# Patient Record
Sex: Female | Born: 1980
Health system: Southern US, Community
[De-identification: ages and names within clinical notes are randomized; demographics above are authoritative.]

## PROBLEM LIST (undated history)

## (undated) DIAGNOSIS — F419 Anxiety disorder, unspecified: Secondary | ICD-10-CM

## (undated) DIAGNOSIS — T7840XA Allergy, unspecified, initial encounter: Secondary | ICD-10-CM

## (undated) DIAGNOSIS — R519 Headache, unspecified: Secondary | ICD-10-CM

## (undated) DIAGNOSIS — G935 Compression of brain: Secondary | ICD-10-CM

## (undated) DIAGNOSIS — D649 Anemia, unspecified: Secondary | ICD-10-CM

## (undated) DIAGNOSIS — J189 Pneumonia, unspecified organism: Secondary | ICD-10-CM

## (undated) DIAGNOSIS — F329 Major depressive disorder, single episode, unspecified: Secondary | ICD-10-CM

## (undated) DIAGNOSIS — F32A Depression, unspecified: Secondary | ICD-10-CM

## (undated) DIAGNOSIS — Z87442 Personal history of urinary calculi: Secondary | ICD-10-CM

## (undated) DIAGNOSIS — E559 Vitamin D deficiency, unspecified: Secondary | ICD-10-CM

## (undated) DIAGNOSIS — K219 Gastro-esophageal reflux disease without esophagitis: Secondary | ICD-10-CM

## (undated) HISTORY — PX: ANKLE SURGERY: SHX546

## (undated) HISTORY — DX: Vitamin D deficiency, unspecified: E55.9

## (undated) HISTORY — DX: Allergy, unspecified, initial encounter: T78.40XA

## (undated) HISTORY — PX: FRACTURE SURGERY: SHX138

## (undated) HISTORY — PX: ABDOMINAL HYSTERECTOMY: SHX81

## (undated) HISTORY — DX: Anemia, unspecified: D64.9

---

## 1999-11-09 HISTORY — PX: TUBAL LIGATION: SHX77

## 2002-03-13 ENCOUNTER — Other Ambulatory Visit: Admission: RE | Admit: 2002-03-13 | Discharge: 2002-03-13 | Payer: Self-pay | Admitting: Unknown Physician Specialty

## 2002-03-15 ENCOUNTER — Ambulatory Visit (HOSPITAL_COMMUNITY): Admission: RE | Admit: 2002-03-15 | Discharge: 2002-03-15 | Payer: Self-pay | Admitting: Unknown Physician Specialty

## 2002-03-15 ENCOUNTER — Encounter: Payer: Self-pay | Admitting: Unknown Physician Specialty

## 2003-07-04 ENCOUNTER — Emergency Department (HOSPITAL_COMMUNITY): Admission: EM | Admit: 2003-07-04 | Discharge: 2003-07-04 | Payer: Self-pay | Admitting: Emergency Medicine

## 2003-07-04 ENCOUNTER — Encounter: Payer: Self-pay | Admitting: Emergency Medicine

## 2003-11-07 ENCOUNTER — Emergency Department (HOSPITAL_COMMUNITY): Admission: EM | Admit: 2003-11-07 | Discharge: 2003-11-07 | Payer: Self-pay | Admitting: Emergency Medicine

## 2007-11-10 ENCOUNTER — Emergency Department (HOSPITAL_COMMUNITY): Admission: EM | Admit: 2007-11-10 | Discharge: 2007-11-10 | Payer: Self-pay | Admitting: Emergency Medicine

## 2007-12-06 ENCOUNTER — Ambulatory Visit: Payer: Self-pay | Admitting: Internal Medicine

## 2007-12-07 DIAGNOSIS — K7689 Other specified diseases of liver: Secondary | ICD-10-CM | POA: Insufficient documentation

## 2007-12-07 DIAGNOSIS — E669 Obesity, unspecified: Secondary | ICD-10-CM | POA: Insufficient documentation

## 2007-12-07 DIAGNOSIS — K589 Irritable bowel syndrome without diarrhea: Secondary | ICD-10-CM | POA: Insufficient documentation

## 2007-12-07 DIAGNOSIS — M19079 Primary osteoarthritis, unspecified ankle and foot: Secondary | ICD-10-CM | POA: Insufficient documentation

## 2007-12-07 DIAGNOSIS — Z87442 Personal history of urinary calculi: Secondary | ICD-10-CM | POA: Insufficient documentation

## 2007-12-07 DIAGNOSIS — K760 Fatty (change of) liver, not elsewhere classified: Secondary | ICD-10-CM | POA: Insufficient documentation

## 2008-05-02 ENCOUNTER — Encounter: Admission: RE | Admit: 2008-05-02 | Discharge: 2008-06-11 | Payer: Self-pay | Admitting: Orthopedic Surgery

## 2011-03-26 NOTE — Assessment & Plan Note (Signed)
[facility]                         GASTROENTEROLOGY OFFICE NOTE   Caroline Hopkins                      MRN:          [id number]  DATE:12/06/2007                            DOB:          September 30, 1981    REFERRING PHYSICIAN:  Helene Kelp, PA-C   REASON FOR CONSULTATION:  Abdominal pain.   ASSESSMENT:  A 30 year old white woman who has had a 75-month history of  right upper quadrant pain radiating across her upper abdomen to the left  upper quadrant associated with severe cramping.  Lately, she has been  having diarrhea.  She has been investigated with blood work, stool  studies, and an ultrasound at the [facility] emergency department  recently.  She has fatty liver and possible hepatomegaly.  She has been  losing some weight.  She is under significant stress with a separation  in her marriage.   Based on all the available data and her exam today, I think she probably  has irritable bowel syndrome and a functional abdominal/gastrointestinal  disturbance.   PLAN:  1. She will send me the lab work from [employer], where      she has been worked up.  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  2. IBS handout is given.  3. Will be a period of observation.  4. Treat with Librax 1 q.6h as needed.  She can take before meals and      at bedtime as well.  5. Promethazine for nausea.  There has been some occasional vomiting      as well.  She has used some Reglan, but that may make diarrhea      worse.  6. Note, she assures me that all her lab work and stool studies have      been unremarkable, including clostridium difficile and cultures.      We will confirm this.  7. Should this not work and she continue to have problems, she is to      call back for another appointment.   HISTORY:  A 30 year old white woman with problems above.  She does have  heartburn for the last 5 years, but this is well-controlled on Prevacid.  The  symptoms she has now, as described above, have been episodic.  They  are not every day.  She clearly has been under a lot of stress with the  separation in her marriage.  See my medical history form for further  details.  Note, she has tried Reglan with some relief of nausea.  She  has used some hydrocodone 5/500 with relief of pain as well.   PAST MEDICAL HISTORY:  1. Osteoarthritis post traumatic after ankle and foot fracture 10      years ago.  2. Obesity.  3. Nephrolithiasis.  4. Allergies.  5. Prior tubal ligation.   MEDICATIONS:  1. Prevacid 30 mg daily.  2. Reglan 5 mg p.r.n.  3. Hydrocodone 5/500 one every 6 to 8 hours p.r.n.   DRUG ALLERGIES:  ROCEPHIN and LATEX.   FAMILY HISTORY/SOCIAL HISTORY/REVIEW OF SYSTEMS:  See my medical history  form for full details.  She smokes and is counseled to quit.  There is  no alcohol or drug use.  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  She is married, 2 sons, and going through the separation as  noted.  Review of systems, see my medical history form for full details  of that.  Ongoing fatigue as well.   PHYSICAL:  Height 5 feet 5 inches, weight 268 pounds, blood pressure  116/68, pulse 76.  This is a very obese young white woman in no acute distress.  The eyes are anicteric.  ENT normal mouth and posterior pharynx.  NECK:  Supple.  No thyromegaly or mass.  CHEST:  Clear.  HEART:  S1, S2.  No murmurs, rubs, or gallops.  ABDOMEN:  Obese.  It is slightly tender to diffuse light touch.  There  is no organomegaly or mass.  Bowel sounds present.  LOWER EXTREMITIES:  Free of edema.  SKIN:  Warm and dry.  No acute rash.  NEURO/PSYCH:  She is alert and oriented x3.  Nonfocal exam.  LYMPHATICS:  No neck or supraclavicular palpated.   Lab data from [facility] emergency department visit is reviewed, as well  as the note.  She was afebrile.  Vital signs normal.  Ultrasound of the  abdomen showed fatty liver, borderline to  mild splenomegaly as well as  borderline to mild hepatomegaly.  Her lipase was normal.  Her LFTs were  normal.  Her I-STAT showed normal electrolytes and hemoglobin.  Her  creatinine was normal as well.  Urinalysis was negative.   I appreciate the opportunity to care for this patient.     Iva Boop, MD,FACG  Electronically Signed    CEG/MedQ  DD: 12/07/2007  DT: 12/07/2007  Job #: [id number]   cc:   Helene Kelp, PA-C

## 2011-07-29 LAB — LIPASE, BLOOD: Lipase: 43

## 2011-07-29 LAB — I-STAT 8, (EC8 V) (CONVERTED LAB)
BUN: 8
Bicarbonate: 25.1 — ABNORMAL HIGH
Chloride: 110
Glucose, Bld: 101 — ABNORMAL HIGH
HCT: 43
Hemoglobin: 14.6
Operator id: 270651
Potassium: 3.7
Sodium: 144
TCO2: 26
pCO2, Ven: 43.6 — ABNORMAL LOW
pH, Ven: 7.368 — ABNORMAL HIGH

## 2011-07-29 LAB — URINALYSIS, ROUTINE W REFLEX MICROSCOPIC
Bilirubin Urine: NEGATIVE
Glucose, UA: NEGATIVE
Hgb urine dipstick: NEGATIVE
Ketones, ur: NEGATIVE
Nitrite: NEGATIVE
Protein, ur: NEGATIVE
Specific Gravity, Urine: 1.014
Urobilinogen, UA: 0.2
pH: 6

## 2011-07-29 LAB — HEPATIC FUNCTION PANEL
ALT: 23
AST: 18
Albumin: 3.8
Alkaline Phosphatase: 59
Bilirubin, Direct: 0.1
Indirect Bilirubin: 0.2 — ABNORMAL LOW
Total Bilirubin: 0.3
Total Protein: 7.6

## 2011-07-29 LAB — POCT PREGNANCY, URINE
Operator id: 27065
Preg Test, Ur: NEGATIVE

## 2011-07-29 LAB — POCT I-STAT CREATININE
Creatinine, Ser: 0.7
Operator id: 270651

## 2012-08-04 ENCOUNTER — Other Ambulatory Visit: Payer: Self-pay | Admitting: Neurosurgery

## 2012-08-04 DIAGNOSIS — R51 Headache: Secondary | ICD-10-CM

## 2012-08-04 DIAGNOSIS — G935 Compression of brain: Secondary | ICD-10-CM

## 2012-08-10 ENCOUNTER — Ambulatory Visit
Admission: RE | Admit: 2012-08-10 | Discharge: 2012-08-10 | Disposition: A | Discharge status: Home / Self Care | Payer: BC Managed Care – PPO | Source: Ambulatory Visit | Attending: Neurosurgery | Admitting: Neurosurgery

## 2012-08-10 DIAGNOSIS — R51 Headache: Secondary | ICD-10-CM

## 2012-08-10 DIAGNOSIS — G935 Compression of brain: Secondary | ICD-10-CM

## 2012-08-21 ENCOUNTER — Other Ambulatory Visit: Payer: Self-pay | Admitting: Neurosurgery

## 2012-08-28 ENCOUNTER — Encounter (HOSPITAL_COMMUNITY): Payer: Self-pay | Admitting: Pharmacy Technician

## 2012-08-31 ENCOUNTER — Encounter (HOSPITAL_COMMUNITY)
Admission: RE | Admit: 2012-08-31 | Discharge: 2012-08-31 | Disposition: A | Discharge status: Home / Self Care | Payer: BC Managed Care – PPO | Source: Ambulatory Visit | Attending: Neurosurgery | Admitting: Neurosurgery

## 2012-08-31 ENCOUNTER — Encounter (HOSPITAL_COMMUNITY): Payer: Self-pay

## 2012-08-31 ENCOUNTER — Ambulatory Visit (HOSPITAL_COMMUNITY)
Admission: RE | Admit: 2012-08-31 | Discharge: 2012-08-31 | Disposition: A | Discharge status: Home / Self Care | Payer: BC Managed Care – PPO | Source: Ambulatory Visit | Attending: Neurosurgery | Admitting: Neurosurgery

## 2012-08-31 DIAGNOSIS — Z01812 Encounter for preprocedural laboratory examination: Secondary | ICD-10-CM | POA: Insufficient documentation

## 2012-08-31 DIAGNOSIS — Z01818 Encounter for other preprocedural examination: Secondary | ICD-10-CM | POA: Insufficient documentation

## 2012-08-31 HISTORY — DX: Gastro-esophageal reflux disease without esophagitis: K21.9

## 2012-08-31 HISTORY — DX: Depression, unspecified: F32.A

## 2012-08-31 HISTORY — DX: Pneumonia, unspecified organism: J18.9

## 2012-08-31 HISTORY — DX: Anxiety disorder, unspecified: F41.9

## 2012-08-31 HISTORY — DX: Major depressive disorder, single episode, unspecified: F32.9

## 2012-08-31 LAB — CBC WITH DIFFERENTIAL/PLATELET
Basophils Absolute: 0 10*3/uL (ref 0.0–0.1)
Basophils Relative: 0 % (ref 0–1)
Eosinophils Absolute: 0.4 10*3/uL (ref 0.0–0.7)
Eosinophils Relative: 3 % (ref 0–5)
HCT: 41.5 % (ref 36.0–46.0)
Hemoglobin: 13.6 g/dL (ref 12.0–15.0)
Lymphocytes Relative: 21 % (ref 12–46)
Lymphs Abs: 2.7 10*3/uL (ref 0.7–4.0)
MCH: 28.5 pg (ref 26.0–34.0)
MCHC: 32.8 g/dL (ref 30.0–36.0)
MCV: 87 fL (ref 78.0–100.0)
Monocytes Absolute: 0.6 10*3/uL (ref 0.1–1.0)
Monocytes Relative: 5 % (ref 3–12)
Neutro Abs: 8.9 10*3/uL — ABNORMAL HIGH (ref 1.7–7.7)
Neutrophils Relative %: 71 % (ref 43–77)
Platelets: 265 10*3/uL (ref 150–400)
RBC: 4.77 MIL/uL (ref 3.87–5.11)
RDW: 13.6 % (ref 11.5–15.5)
WBC: 12.6 10*3/uL — ABNORMAL HIGH (ref 4.0–10.5)

## 2012-08-31 LAB — BASIC METABOLIC PANEL
BUN: 7 mg/dL (ref 6–23)
CO2: 26 mEq/L (ref 19–32)
Calcium: 9.4 mg/dL (ref 8.4–10.5)
Chloride: 100 mEq/L (ref 96–112)
Creatinine, Ser: 0.55 mg/dL (ref 0.50–1.10)
GFR calc Af Amer: >90 mL/min (ref >90)
GFR calc non Af Amer: >90 mL/min (ref >90)
Glucose, Bld: 97 mg/dL (ref 70–99)
Potassium: 3 mEq/L — ABNORMAL LOW (ref 3.5–5.1)
Sodium: 137 mEq/L (ref 135–145)

## 2012-08-31 LAB — URINALYSIS, ROUTINE W REFLEX MICROSCOPIC
Bilirubin Urine: NEGATIVE
Glucose, UA: NEGATIVE mg/dL
Ketones, ur: NEGATIVE mg/dL
Leukocytes, UA: NEGATIVE
Nitrite: NEGATIVE
Protein, ur: NEGATIVE mg/dL
Specific Gravity, Urine: 1.016 (ref 1.005–1.030)
Urobilinogen, UA: 0.2 mg/dL (ref 0.0–1.0)
pH: 6 (ref 5.0–8.0)

## 2012-08-31 LAB — URINE MICROSCOPIC-ADD ON

## 2012-08-31 LAB — SURGICAL PCR SCREEN
MRSA, PCR: NEGATIVE
Staphylococcus aureus: NEGATIVE

## 2012-08-31 LAB — HCG, SERUM, QUALITATIVE: Preg, Serum: NEGATIVE

## 2012-08-31 LAB — TYPE AND SCREEN
ABO/RH(D): B POS
Antibody Screen: NEGATIVE

## 2012-08-31 LAB — ABO/RH: ABO/RH(D): B POS

## 2012-08-31 NOTE — Pre-Procedure Instructions (Signed)
[address]  08/31/2012   Your procedure is scheduled on:  Monday September 11, 2012 at 0730 AM  Report to [facility] at 0530 AM.  Call this number if you have problems the morning of surgery: [phone]   Remember:   Do not eat food or drink:After Midnight.Sunday       Take these medicines the morning of surgery with A SIP OF WATER: Celexa   Do not wear jewelry, make-up or nail polish.  Do not wear lotions, powders, or perfumes. You may wear deodorant.  Do not shave 48 hours prior to surgery.   Do not bring valuables to the hospital.  Contacts, dentures or bridgework may not be worn into surgery.  Leave suitcase in the car. After surgery it may be brought to your room.  For patients admitted to the hospital, checkout time is 11:00 AM the day of discharge.   Patients discharged the day of surgery will not be allowed to drive home.    Special Instructions: Shower using CHG 2 nights before surgery and the night before surgery.  If you shower the day of surgery use CHG.  Use special wash - you have one bottle of CHG for all showers.  You should use approximately 1/3 of the bottle for each shower.   Please read over the following fact sheets that you were given: Pain Booklet, Coughing and Deep Breathing, Blood Transfusion Information, MRSA Information and Surgical Site Infection Prevention

## 2012-09-01 NOTE — Consult Note (Signed)
Anesthesia Chart Review:  Patient is a 31 year old female scheduled for Chiari decompression, suboccipital craniectomy with C1 laminectomy, dural patch graft by Dr. Phoebe Perch on 09/11/12.  Other history includes smoking, morbid obesity, anxiety, depression, PNA, GERD, tubal ligation.  No PCP is listed.  Labs noted.  CXR on 08/31/12 showed no active lung disease.  EKG on 08/31/12 (ordered by Dr. Phoebe Perch) showed SR with short PR, cannot rule out anterior infarct (age undetermined).  There is no documentation of any tachyarrhythmia history.  I reviewed with Anesthesiologist Dr. Noreene Larsson.  If no worrisome CV symptoms (none documented at PAT) then plan to proceed.  Shonna Chock, PA-C

## 2012-09-04 NOTE — Progress Notes (Signed)
Spoke with Shanda Bumps ... At Dr Charlynn Court office.. Dr always like pt/ptt done on his patients on the day of surgery .

## 2012-09-11 ENCOUNTER — Ambulatory Visit (HOSPITAL_COMMUNITY): Payer: BC Managed Care – PPO | Admitting: Vascular Surgery

## 2012-09-11 ENCOUNTER — Encounter (HOSPITAL_COMMUNITY): Payer: Self-pay | Admitting: *Deleted

## 2012-09-11 ENCOUNTER — Inpatient Hospital Stay (HOSPITAL_COMMUNITY)
Admission: RE | Admit: 2012-09-11 | Discharge: 2012-09-14 | DRG: 002 | Disposition: A | Discharge status: Home / Self Care | Payer: BC Managed Care – PPO | Source: Ambulatory Visit | Attending: Neurosurgery | Admitting: Neurosurgery

## 2012-09-11 ENCOUNTER — Encounter (HOSPITAL_COMMUNITY)
Admission: RE | Disposition: A | Discharge status: Home / Self Care | Payer: Self-pay | Source: Ambulatory Visit | Attending: Neurosurgery

## 2012-09-11 ENCOUNTER — Encounter (HOSPITAL_COMMUNITY): Payer: Self-pay | Admitting: Vascular Surgery

## 2012-09-11 DIAGNOSIS — G935 Compression of brain: Principal | ICD-10-CM | POA: Diagnosis present

## 2012-09-11 DIAGNOSIS — Z6841 Body Mass Index (BMI) 40.0 and over, adult: Secondary | ICD-10-CM

## 2012-09-11 DIAGNOSIS — E669 Obesity, unspecified: Secondary | ICD-10-CM | POA: Diagnosis present

## 2012-09-11 DIAGNOSIS — Z23 Encounter for immunization: Secondary | ICD-10-CM

## 2012-09-11 HISTORY — PX: SUBOCCIPITAL CRANIECTOMY CERVICAL LAMINECTOMY: SHX5404

## 2012-09-11 LAB — PROTIME-INR
INR: 1.05 (ref 0.00–1.49)
Prothrombin Time: 13.6 seconds (ref 11.6–15.2)

## 2012-09-11 LAB — APTT: aPTT: 33 seconds (ref 24–37)

## 2012-09-11 SURGERY — SUBOCCIPITAL CRANIECTOMY CERVICAL LAMINECTOMY/DURAPLASTY
Anesthesia: General | Site: Neck | Wound class: Clean

## 2012-09-11 MED ORDER — PROMETHAZINE HCL 25 MG PO TABS: 12.5000 mg | ORAL_TABLET | ORAL | Status: DC | PRN

## 2012-09-11 MED ORDER — SODIUM CHLORIDE 0.9 % IV SOLN
INTRAVENOUS | Status: DC | PRN
  Administered 2012-09-11: 07:00:00 via INTRAVENOUS

## 2012-09-11 MED ORDER — PROMETHAZINE HCL 25 MG/ML IJ SOLN
12.5000 mg | INTRAMUSCULAR | Status: DC | PRN
  Administered 2012-09-11: 12.5 mg via INTRAVENOUS
  Filled 2012-09-11 (×2): qty 1

## 2012-09-11 MED ORDER — MAGNESIUM HYDROXIDE 400 MG/5ML PO SUSP: 30.0000 mL | Freq: Every day | ORAL | Status: DC | PRN

## 2012-09-11 MED ORDER — LIDOCAINE HCL 4 % MT SOLN
OROMUCOSAL | Status: DC | PRN
  Administered 2012-09-11: 4 mL via TOPICAL

## 2012-09-11 MED ORDER — BACITRACIN 50000 UNITS IM SOLR
INTRAMUSCULAR | Status: DC | PRN
  Administered 2012-09-11 (×2)

## 2012-09-11 MED ORDER — VANCOMYCIN HCL 1000 MG IV SOLR
1500.0000 mg | Freq: Once | INTRAVENOUS | Status: AC
  Administered 2012-09-11: 1500 mg via INTRAVENOUS
  Filled 2012-09-11: qty 1500

## 2012-09-11 MED ORDER — SODIUM CHLORIDE 0.9 % IV SOLN: 250.0000 mL | INTRAVENOUS | Status: DC

## 2012-09-11 MED ORDER — METHOCARBAMOL 500 MG PO TABS
500.0000 mg | ORAL_TABLET | Freq: Four times a day (QID) | ORAL | Status: DC | PRN
  Administered 2012-09-14: 500 mg via ORAL
  Filled 2012-09-11 (×2): qty 1

## 2012-09-11 MED ORDER — SODIUM CHLORIDE 0.9 % IV SOLN
INTRAVENOUS | Status: DC | PRN
  Administered 2012-09-11: 08:00:00 via INTRAVENOUS

## 2012-09-11 MED ORDER — LACTATED RINGERS IV SOLN: INTRAVENOUS | Status: DC | PRN

## 2012-09-11 MED ORDER — ALBUTEROL SULFATE HFA 108 (90 BASE) MCG/ACT IN AERS
INHALATION_SPRAY | RESPIRATORY_TRACT | Status: DC | PRN
  Administered 2012-09-11: 4 via RESPIRATORY_TRACT

## 2012-09-11 MED ORDER — BISACODYL 10 MG RE SUPP: 10.0000 mg | Freq: Every day | RECTAL | Status: DC | PRN

## 2012-09-11 MED ORDER — ACETAMINOPHEN 10 MG/ML IV SOLN
1000.0000 mg | Freq: Once | INTRAVENOUS | Status: AC | PRN
  Administered 2012-09-11: 1000 mg via INTRAVENOUS

## 2012-09-11 MED ORDER — CYCLOBENZAPRINE HCL 10 MG PO TABS
10.0000 mg | ORAL_TABLET | Freq: Three times a day (TID) | ORAL | Status: DC | PRN
  Administered 2012-09-13 (×2): 10 mg via ORAL
  Filled 2012-09-11 (×3): qty 1

## 2012-09-11 MED ORDER — LACTATED RINGERS IV SOLN
INTRAVENOUS | Status: DC | PRN
  Administered 2012-09-11: 10:00:00 via INTRAVENOUS

## 2012-09-11 MED ORDER — OXYCODONE-ACETAMINOPHEN 5-325 MG PO TABS
1.0000 | ORAL_TABLET | ORAL | Status: DC | PRN
  Administered 2012-09-11: 2 via ORAL
  Filled 2012-09-11 (×2): qty 2

## 2012-09-11 MED ORDER — ARTIFICIAL TEARS OP OINT
TOPICAL_OINTMENT | OPHTHALMIC | Status: DC | PRN
  Administered 2012-09-11: 1 via OPHTHALMIC

## 2012-09-11 MED ORDER — VANCOMYCIN HCL 1000 MG IV SOLR
1500.0000 mg | Freq: Once | INTRAVENOUS | Status: DC
  Filled 2012-09-11: qty 1500

## 2012-09-11 MED ORDER — ZOLPIDEM TARTRATE 5 MG PO TABS: 5.0000 mg | ORAL_TABLET | Freq: Every evening | ORAL | Status: DC | PRN

## 2012-09-11 MED ORDER — ONDANSETRON HCL 4 MG/2ML IJ SOLN
INTRAMUSCULAR | Status: DC | PRN
  Administered 2012-09-11: 4 mg via INTRAVENOUS

## 2012-09-11 MED ORDER — ACETAMINOPHEN 10 MG/ML IV SOLN
INTRAVENOUS | Status: AC
  Filled 2012-09-11: qty 100

## 2012-09-11 MED ORDER — MICROFIBRILLAR COLL HEMOSTAT EX PADS
MEDICATED_PAD | CUTANEOUS | Status: DC | PRN
  Administered 2012-09-11: 1 via TOPICAL

## 2012-09-11 MED ORDER — BACITRACIN ZINC 500 UNIT/GM EX OINT
TOPICAL_OINTMENT | CUTANEOUS | Status: DC | PRN
  Administered 2012-09-11: 1 via TOPICAL

## 2012-09-11 MED ORDER — ALBUMIN HUMAN 5 % IV SOLN
INTRAVENOUS | Status: DC | PRN
  Administered 2012-09-11: 10:00:00 via INTRAVENOUS

## 2012-09-11 MED ORDER — BACITRACIN 50000 UNITS IM SOLR
INTRAMUSCULAR | Status: AC
  Filled 2012-09-11: qty 1

## 2012-09-11 MED ORDER — ACETAMINOPHEN 650 MG RE SUPP: 650.0000 mg | RECTAL | Status: DC | PRN

## 2012-09-11 MED ORDER — ACETAMINOPHEN 325 MG PO TABS: 650.0000 mg | ORAL_TABLET | ORAL | Status: DC | PRN

## 2012-09-11 MED ORDER — DIAZEPAM 5 MG PO TABS
5.0000 mg | ORAL_TABLET | Freq: Four times a day (QID) | ORAL | Status: DC | PRN
  Administered 2012-09-13 – 2012-09-14 (×3): 5 mg via ORAL
  Filled 2012-09-11 (×3): qty 1

## 2012-09-11 MED ORDER — SODIUM CHLORIDE 0.9 % IV SOLN
INTRAVENOUS | Status: AC
  Filled 2012-09-11: qty 500

## 2012-09-11 MED ORDER — VANCOMYCIN HCL 1000 MG IV SOLR
INTRAVENOUS | Status: AC
  Filled 2012-09-11: qty 2000

## 2012-09-11 MED ORDER — CITALOPRAM HYDROBROMIDE 20 MG PO TABS
20.0000 mg | ORAL_TABLET | Freq: Every day | ORAL | Status: DC
Start: 2012-09-12 — End: 2012-09-14
  Administered 2012-09-12 – 2012-09-14 (×3): 20 mg via ORAL
  Filled 2012-09-11 (×3): qty 1

## 2012-09-11 MED ORDER — MORPHINE SULFATE 2 MG/ML IJ SOLN
1.0000 mg | INTRAMUSCULAR | Status: DC | PRN
  Administered 2012-09-11 – 2012-09-12 (×2): 2 mg via INTRAVENOUS
  Administered 2012-09-13: 4 mg via INTRAVENOUS
  Filled 2012-09-11: qty 1
  Filled 2012-09-11: qty 2
  Filled 2012-09-11: qty 1

## 2012-09-11 MED ORDER — ROCURONIUM BROMIDE 100 MG/10ML IV SOLN
INTRAVENOUS | Status: DC | PRN
  Administered 2012-09-11: 50 mg via INTRAVENOUS
  Administered 2012-09-11 (×2): 10 mg via INTRAVENOUS

## 2012-09-11 MED ORDER — PROPOFOL 10 MG/ML IV BOLUS
INTRAVENOUS | Status: DC | PRN
  Administered 2012-09-11: 50 mg via INTRAVENOUS
  Administered 2012-09-11: 150 mg via INTRAVENOUS

## 2012-09-11 MED ORDER — KETOROLAC TROMETHAMINE 30 MG/ML IJ SOLN
30.0000 mg | Freq: Four times a day (QID) | INTRAMUSCULAR | Status: AC
  Administered 2012-09-11 – 2012-09-12 (×5): 30 mg via INTRAVENOUS
  Filled 2012-09-11 (×8): qty 1

## 2012-09-11 MED ORDER — LACTATED RINGERS IV SOLN
INTRAVENOUS | Status: DC
  Administered 2012-09-11: 15:00:00 via INTRAVENOUS

## 2012-09-11 MED ORDER — METHOCARBAMOL 100 MG/ML IJ SOLN
500.0000 mg | Freq: Four times a day (QID) | INTRAVENOUS | Status: DC | PRN
  Filled 2012-09-11: qty 5

## 2012-09-11 MED ORDER — FENTANYL CITRATE 0.05 MG/ML IJ SOLN
INTRAMUSCULAR | Status: DC | PRN
  Administered 2012-09-11 (×2): 100 ug via INTRAVENOUS
  Administered 2012-09-11: 50 ug via INTRAVENOUS

## 2012-09-11 MED ORDER — VANCOMYCIN HCL IN DEXTROSE 1-5 GM/200ML-% IV SOLN: 1000.0000 mg | INTRAVENOUS | Status: DC

## 2012-09-11 MED ORDER — THROMBIN 20000 UNITS EX SOLR
CUTANEOUS | Status: DC | PRN
  Administered 2012-09-11 (×2): via TOPICAL

## 2012-09-11 MED ORDER — HYDROCODONE-ACETAMINOPHEN 5-325 MG PO TABS
1.0000 | ORAL_TABLET | ORAL | Status: DC | PRN
  Administered 2012-09-12 – 2012-09-13 (×5): 2 via ORAL
  Administered 2012-09-14: 1 via ORAL
  Administered 2012-09-14: 2 via ORAL
  Filled 2012-09-11 (×7): qty 2

## 2012-09-11 MED ORDER — DOCUSATE SODIUM 100 MG PO CAPS
100.0000 mg | ORAL_CAPSULE | Freq: Two times a day (BID) | ORAL | Status: DC
  Administered 2012-09-12 – 2012-09-14 (×5): 100 mg via ORAL
  Filled 2012-09-11 (×7): qty 1

## 2012-09-11 MED ORDER — SODIUM CHLORIDE 0.9 % IJ SOLN
3.0000 mL | Freq: Two times a day (BID) | INTRAMUSCULAR | Status: DC
  Administered 2012-09-11 – 2012-09-14 (×6): 3 mL via INTRAVENOUS

## 2012-09-11 MED ORDER — HYDROMORPHONE HCL PF 1 MG/ML IJ SOLN
INTRAMUSCULAR | Status: AC
  Filled 2012-09-11: qty 1

## 2012-09-11 MED ORDER — SODIUM CHLORIDE 0.9 % IJ SOLN: 3.0000 mL | INTRAMUSCULAR | Status: DC | PRN

## 2012-09-11 MED ORDER — TRAMADOL HCL 50 MG PO TABS
50.0000 mg | ORAL_TABLET | Freq: Four times a day (QID) | ORAL | Status: DC | PRN
  Administered 2012-09-12: 50 mg via ORAL
  Filled 2012-09-11 (×2): qty 1

## 2012-09-11 MED ORDER — VANCOMYCIN HCL 1000 MG IV SOLR
1500.0000 mg | INTRAVENOUS | Status: AC
  Administered 2012-09-11: 1500 mg via INTRAVENOUS
  Filled 2012-09-11 (×2): qty 1500

## 2012-09-11 MED ORDER — LIDOCAINE HCL (CARDIAC) 20 MG/ML IV SOLN
INTRAVENOUS | Status: DC | PRN
  Administered 2012-09-11: 40 mg via INTRAVENOUS

## 2012-09-11 MED ORDER — ONDANSETRON HCL 4 MG/2ML IJ SOLN
INTRAMUSCULAR | Status: AC
  Filled 2012-09-11: qty 2

## 2012-09-11 MED ORDER — ONDANSETRON HCL 4 MG/2ML IJ SOLN: 4.0000 mg | Freq: Once | INTRAMUSCULAR | Status: DC | PRN

## 2012-09-11 MED ORDER — HYDROMORPHONE HCL PF 1 MG/ML IJ SOLN
0.2500 mg | INTRAMUSCULAR | Status: DC | PRN
  Administered 2012-09-11: 0.5 mg via INTRAVENOUS

## 2012-09-11 MED ORDER — 0.9 % SODIUM CHLORIDE (POUR BTL) OPTIME
TOPICAL | Status: DC | PRN
  Administered 2012-09-11: 1000 mL

## 2012-09-11 MED ORDER — ONDANSETRON HCL 4 MG/2ML IJ SOLN
4.0000 mg | INTRAMUSCULAR | Status: DC | PRN
  Administered 2012-09-11 – 2012-09-12 (×4): 4 mg via INTRAVENOUS
  Filled 2012-09-11 (×4): qty 2

## 2012-09-11 MED ORDER — LIDOCAINE-EPINEPHRINE 1 %-1:100000 IJ SOLN
INTRAMUSCULAR | Status: DC | PRN
  Administered 2012-09-11: 25 mL

## 2012-09-11 MED ORDER — MIDAZOLAM HCL 5 MG/5ML IJ SOLN
INTRAMUSCULAR | Status: DC | PRN
  Administered 2012-09-11: 2 mg via INTRAVENOUS

## 2012-09-11 SURGICAL SUPPLY — 69 items
BAG DECANTER FOR FLEXI CONT (MISCELLANEOUS) ×4 IMPLANT
BENZOIN TINCTURE PRP APPL 2/3 (GAUZE/BANDAGES/DRESSINGS) ×2 IMPLANT
BLADE SURG ROTATE 9660 (MISCELLANEOUS) ×4 IMPLANT
BLADE ULTRA TIP 2M (BLADE) ×2 IMPLANT
BRUSH SCRUB EZ 1% IODOPHOR (MISCELLANEOUS) IMPLANT
BUR ACORN 6.0 PRECISION (BURR) ×2 IMPLANT
BUR MATCHSTICK NEURO 3.0 LAGG (BURR) ×2 IMPLANT
CANISTER SUCTION 2500CC (MISCELLANEOUS) ×2 IMPLANT
CLIP TI MEDIUM 6 (CLIP) ×2 IMPLANT
CLOTH BEACON ORANGE TIMEOUT ST (SAFETY) ×2 IMPLANT
CONT SPEC 4OZ CLIKSEAL STRL BL (MISCELLANEOUS) ×2 IMPLANT
CORDS BIPOLAR (ELECTRODE) ×2 IMPLANT
COVER MAYO STAND STRL (DRAPES) ×2 IMPLANT
COVER TABLE BACK 60X90 (DRAPES) IMPLANT
DRAPE LAPAROTOMY 100X72 PEDS (DRAPES) ×2 IMPLANT
DRAPE MICROSCOPE LEICA (MISCELLANEOUS) ×2 IMPLANT
DRAPE WARM FLUID 44X44 (DRAPE) IMPLANT
DRESSING TELFA 8X3 (GAUZE/BANDAGES/DRESSINGS) ×2 IMPLANT
DURAGUARD 04CMX04CM ×2 IMPLANT
DURASEAL SPINE SEALANT 3ML (MISCELLANEOUS) ×2 IMPLANT
ELECT CAUTERY BLADE 6.4 (BLADE) ×2 IMPLANT
ELECT REM PT RETURN 9FT ADLT (ELECTROSURGICAL) ×2
ELECTRODE REM PT RTRN 9FT ADLT (ELECTROSURGICAL) ×1 IMPLANT
EXTENDED TIP APPLICATOR ×2 IMPLANT
GAUZE SPONGE 4X4 16PLY XRAY LF (GAUZE/BANDAGES/DRESSINGS) IMPLANT
GLOVE BIOGEL PI IND STRL 6.5 (GLOVE) ×2 IMPLANT
GLOVE BIOGEL PI IND STRL 8.5 (GLOVE) ×1 IMPLANT
GLOVE BIOGEL PI INDICATOR 6.5 (GLOVE) ×2
GLOVE BIOGEL PI INDICATOR 8.5 (GLOVE) ×1
GLOVE ECLIPSE 7.5 STRL STRAW (GLOVE) IMPLANT
GLOVE EXAM NITRILE LRG STRL (GLOVE) IMPLANT
GLOVE EXAM NITRILE MD LF STRL (GLOVE) IMPLANT
GLOVE EXAM NITRILE XL STR (GLOVE) IMPLANT
GLOVE EXAM NITRILE XS STR PU (GLOVE) IMPLANT
GLOVE SURG SS PI 6.5 STRL IVOR (GLOVE) ×10 IMPLANT
GLOVE SURG SS PI 8.5 STRL IVOR (GLOVE) ×1
GLOVE SURG SS PI 8.5 STRL STRW (GLOVE) ×1 IMPLANT
GOWN BRE IMP SLV AUR LG STRL (GOWN DISPOSABLE) ×4 IMPLANT
GOWN BRE IMP SLV AUR XL STRL (GOWN DISPOSABLE) IMPLANT
GOWN STRL REIN 2XL LVL4 (GOWN DISPOSABLE) ×2 IMPLANT
KIT BASIN OR (CUSTOM PROCEDURE TRAY) ×2 IMPLANT
KIT ROOM TURNOVER OR (KITS) ×2 IMPLANT
NEEDLE HYPO 22GX1.5 SAFETY (NEEDLE) ×2 IMPLANT
NS IRRIG 1000ML POUR BTL (IV SOLUTION) ×2 IMPLANT
PACK CRANIOTOMY (CUSTOM PROCEDURE TRAY) ×2 IMPLANT
PAD ARMBOARD 7.5X6 YLW CONV (MISCELLANEOUS) ×6 IMPLANT
PATTIES SURGICAL .75X.75 (GAUZE/BANDAGES/DRESSINGS) ×2 IMPLANT
RUBBERBAND STERILE (MISCELLANEOUS) ×4 IMPLANT
SPONGE GAUZE 4X4 12PLY (GAUZE/BANDAGES/DRESSINGS) ×2 IMPLANT
SPONGE LAP 4X18 X RAY DECT (DISPOSABLE) IMPLANT
SPONGE SURGIFOAM ABS GEL 100 (HEMOSTASIS) ×2 IMPLANT
STAPLER SKIN PROX WIDE 3.9 (STAPLE) ×2 IMPLANT
STRIP CLOSURE SKIN 1/2X4 (GAUZE/BANDAGES/DRESSINGS) ×2 IMPLANT
SUT ETHILON 3 0 FSL (SUTURE) ×2 IMPLANT
SUT NURALON 4 0 TR CR/8 (SUTURE) ×2 IMPLANT
SUT VIC AB 0 CT1 18XCR BRD8 (SUTURE) ×2 IMPLANT
SUT VIC AB 0 CT1 8-18 (SUTURE) ×2
SUT VIC AB 2-0 CP2 18 (SUTURE) ×2 IMPLANT
SUT VIC AB 3-0 SH 8-18 (SUTURE) ×2 IMPLANT
SYR 20ML ECCENTRIC (SYRINGE) ×2 IMPLANT
SYR CONTROL 10ML LL (SYRINGE) ×2 IMPLANT
TAPE CLOTH SURG 4X10 WHT LF (GAUZE/BANDAGES/DRESSINGS) ×2 IMPLANT
TOWEL OR 17X24 6PK STRL BLUE (TOWEL DISPOSABLE) ×2 IMPLANT
TOWEL OR 17X26 10 PK STRL BLUE (TOWEL DISPOSABLE) ×4 IMPLANT
TRAY FOLEY BAG SILVER LF 14FR (CATHETERS) ×2 IMPLANT
TRAY FOLEY CATH 14FRSI W/METER (CATHETERS) IMPLANT
UNDERPAD 30X30 INCONTINENT (UNDERPADS AND DIAPERS) IMPLANT
WATER STERILE IRR 1000ML POUR (IV SOLUTION) ×2 IMPLANT
XYLOCAINE 1% W/EPI 1:100,000 IMPLANT

## 2012-09-11 NOTE — Progress Notes (Signed)
ANTIBIOTIC CONSULT NOTE - INITIAL  Pharmacy Consult for Vancomycin Indication: Post-op Surgical Prophylaxis Allergies  Allergen Reactions  . Rocephin (Ceftriaxone) Anaphylaxis  . Latex Other (See Comments)    Blisters     Patient Measurements: Height: 5\' 5"  (165.1 cm) Weight: 258 lb 9.6 oz (117.3 kg) IBW/kg (Calculated) : 57  Adjusted Body Weight:   Vital Signs: Temp: 97.7 F (36.5 C) (11/04 1300) Temp src: Oral (11/04 1300) BP: 142/71 mmHg (11/04 1300) Pulse Rate: 77  (11/04 1300) Intake/Output from previous day:   Intake/Output from this shift: Total I/O In: 3752 [I.V.:2950; Other:550; IV Piggyback:252] Out: 800 [Urine:700; Blood:100]  Labs: No results found for this basename: WBC:3,HGB:3,PLT:3,LABCREA:3,CREATININE:3 in the last 72 hours Estimated Creatinine Clearance: 129.3 ml/min (by C-G formula based on Cr of 0.55). No results found for this basename: VANCOTROUGH:2,VANCOPEAK:2,VANCORANDOM:2,GENTTROUGH:2,GENTPEAK:2,GENTRANDOM:2,TOBRATROUGH:2,TOBRAPEAK:2,TOBRARND:2,AMIKACINPEAK:2,AMIKACINTROU:2,AMIKACIN:2, in the last 72 hours   Microbiology: Recent Results (from the past 720 hour(s))  SURGICAL PCR SCREEN     Status: Normal   Collection Time   08/31/12  4:19 PM      Component Value Range Status Comment   MRSA, PCR NEGATIVE  NEGATIVE Final    Staphylococcus aureus NEGATIVE  NEGATIVE Final     Medical History: Past Medical History  Diagnosis Date  . Anxiety   . Depression   . Pneumonia     every year  . GERD (gastroesophageal reflux disease)    Assessment: 31yof to receive Vancomycin for post-op surgical prophylaxis with Cephalosporin allergy.  Patient received Vancomycin 1.5g IV pre-op (~0810). Per Op note, no drains were placed - will order 1 dose of Vancomycin per protocol. - Wt: 117kg - Crcl >100 ml/min  Goal of Therapy:  Vancomycin trough level 10-15 mcg/ml  Plan:  1. Vancomycin 1.5g IV x 1 dose @ 2000 tonight 2. Pharmacy will sign-off. Please  reconsult if additional assistance is needed.  Cleon Dew [phone] 09/11/2012,1:17 PM

## 2012-09-11 NOTE — H&P (Signed)
See H& P.

## 2012-09-11 NOTE — Interval H&P Note (Signed)
History and Physical Interval Note:  09/11/2012 7:52 AM  Caroline Hopkins  has presented today for surgery, with the diagnosis of Chiari malformation  The various methods of treatment have been discussed with the patient and family. After consideration of risks, benefits and other options for treatment, the patient has consented to  Procedure(s) (LRB) with comments: SUBOCCIPITAL CRANIECTOMY CERVICAL LAMINECTOMY/DURAPLASTY (N/A) - Chiari decompression/Suboccipital craniectomy with C1 laminectomy/dural patch graft as a surgical intervention .  The patient's history has been reviewed, patient examined, no change in status, stable for surgery.  I have reviewed the patient's chart and labs.  Questions were answered to the patient's satisfaction.     Kadisha Goodine R

## 2012-09-11 NOTE — Progress Notes (Signed)
Dr. Blanche East ordered aline removed as opposed to dr. Wynetta Emery

## 2012-09-11 NOTE — Anesthesia Postprocedure Evaluation (Signed)
  Anesthesia Post-op Note  Patient: Caroline Hopkins  Procedure(s) Performed: Procedure(s) (LRB) with comments: SUBOCCIPITAL CRANIECTOMY CERVICAL LAMINECTOMY/DURAPLASTY (N/A) - Chiari decompression/Suboccipital craniectomy with Cervical one laminectomy/dural patch graft  Patient Location: PACU  Anesthesia Type:General  Level of Consciousness: awake, alert  and oriented  Airway and Oxygen Therapy: Patient Spontanous Breathing  Post-op Pain: mild  Post-op Assessment: Post-op Vital signs reviewed and Patient's Cardiovascular Status Stable  Post-op Vital Signs: stable  Complications: No apparent anesthesia complications

## 2012-09-11 NOTE — Transfer of Care (Signed)
Immediate Anesthesia Transfer of Care Note  Patient: Caroline Hopkins  Procedure(s) Performed: Procedure(s) (LRB) with comments: SUBOCCIPITAL CRANIECTOMY CERVICAL LAMINECTOMY/DURAPLASTY (N/A) - Chiari decompression/Suboccipital craniectomy with Cervical one laminectomy/dural patch graft  Patient Location: PACU  Anesthesia Type:General  Level of Consciousness: awake, alert , oriented, sedated and pateint uncooperative  Airway & Oxygen Therapy: Patient Spontanous Breathing and Patient connected to nasal cannula oxygen  Post-op Assessment: Report given to PACU RN, Post -op Vital signs reviewed and stable and Patient moving all extremities  Post vital signs: Reviewed and stable  Complications: No apparent anesthesia complications

## 2012-09-11 NOTE — Anesthesia Preprocedure Evaluation (Signed)
Anesthesia Evaluation  Patient identified by MRN, date of birth, ID band Patient awake    Reviewed: Allergy & Precautions, H&P , NPO status , Patient's Chart, lab work & pertinent test results  Airway Mallampati: II      Dental  (+) Teeth Intact and Dental Advisory Given   Pulmonary  breath sounds clear to auscultation        Cardiovascular Rhythm:Regular Rate:Normal     Neuro/Psych    GI/Hepatic   Endo/Other    Renal/GU      Musculoskeletal   Abdominal (+) + obese,  Abdomen: soft.    Peds  Hematology   Anesthesia Other Findings   Reproductive/Obstetrics                           Anesthesia Physical Anesthesia Plan  ASA: III  Anesthesia Plan: General   Post-op Pain Management:    Induction: Intravenous  Airway Management Planned: Oral ETT  Additional Equipment:   Intra-op Plan:   Post-operative Plan: Extubation in OR  Informed Consent: I have reviewed the patients History and Physical, chart, labs and discussed the procedure including the risks, benefits and alternatives for the proposed anesthesia with the patient or authorized representative who has indicated his/her understanding and acceptance.   Dental advisory given  Plan Discussed with:   Anesthesia Plan Comments: (Chiari malformation for decompression with C1 laminectomy Obesity Anxiety  Plan GA with art line  Kipp Brood, MD)        Anesthesia Quick Evaluation

## 2012-09-11 NOTE — Progress Notes (Signed)
Aline in l radial upon arrival. dcd per dr. Wynetta Emery. Patient tolerated well

## 2012-09-11 NOTE — Op Note (Signed)
09/11/2012  11:19 AM  PATIENT:  Caroline Hopkins  31 y.o. female  PRE-OPERATIVE DIAGNOSIS:  Chiari malformation  POST-OPERATIVE DIAGNOSIS:  Chiari malformation  PROCEDURE:  Procedure(s): SUBOCCIPITAL CRANIECTOMY CERVICAL LAMINECTOMY/DURAPLASTY, microdisection  SURGEON:  Surgeon(s): Clydene Fake, MD Barnett Abu, MD-assist   ANESTHESIA:   general  EBL:  Total I/O In: 2900 [I.V.:2900] Out: 200 [Urine:100; Blood:100]  BLOOD ADMINISTERED:none  DRAINS: none   SPECIMEN:  No Specimen  DICTATION: Patient with vision and hearing problems as Chiari malformation care he malformation his worsened over the last couple years with a Steinmann rod and symptoms of developing worsened also llhermites.  Patient brought in for suboccipital craniotomy decompression chiari malformation.  Patient brought into hip and general anesthesia induced patient placed in [place] head pins and placed in prone position patient was prepped draped sterile fashion and sent incision inject with 25 cc 1% with epinephrine. Incision was then made midline the suboccipital area and upper cervical area incision taken down to the fascia hemostasis obtained with position fascia was incised and the dissected down the midline and exposed the surgical scar in the C1 and C2 lamina. High-speed drill was used to this was craniectomy and in C1 laminectomy is completed with Kerrison punches. It hemostasis with bipolar cauterization and Gelfoam thrombin and appeared dura was then opened in a Y-shaped fashion and tacked up laterally and superior. Duraplasty was then done using DuraGen we couldn't saw was and suturing in place with 4-0 Nurolon running sutures. We brought the microscope in for microdissection for suture also for her removing arachnoid in several tonsils and decompressing the tonsils. Tissue was then placed over the dural closure retractors removed with very good hemostasis fascia closed 0 Vicryl interrupted sutures  subcutaneous incision closed with 2 Vicryl interrupted sutures skin closed with staples dressing was placed back in spine position removed from head pins (and transferred to recovery.  PLAN OF CARE: Admit to inpatient   PATIENT DISPOSITION:  PACU - hemodynamically stable.

## 2012-09-11 NOTE — Preoperative (Signed)
Beta Blockers   Reason not to administer Beta Blockers:Not Applicable 

## 2012-09-12 ENCOUNTER — Encounter (HOSPITAL_COMMUNITY): Payer: Self-pay | Admitting: Neurosurgery

## 2012-09-12 MED ORDER — PANTOPRAZOLE SODIUM 40 MG PO TBEC
40.0000 mg | DELAYED_RELEASE_TABLET | Freq: Every day | ORAL | Status: DC
  Administered 2012-09-12 – 2012-09-14 (×3): 40 mg via ORAL
  Filled 2012-09-12 (×3): qty 1

## 2012-09-12 NOTE — Progress Notes (Signed)
Subjective: Patient reports N/V last night - better now -  Mod incis pain  Objective: Vital signs in last 24 hours: Temp:  [97.2 F (36.2 C)-98.7 F (37.1 C)] 98 F (36.7 C) (11/05 0700) Pulse Rate:  [53-88] 53  (11/05 0700) Resp:  [14-23] 16  (11/05 0700) BP: (103-153)/(65-89) 103/66 mmHg (11/05 0700) SpO2:  [95 %-100 %] 95 % (11/05 0700) Arterial Line BP: (131)/(96) 131/96 mmHg (11/04 1130) Weight:  [117.3 kg (258 lb 9.6 oz)] 117.3 kg (258 lb 9.6 oz) (11/04 1300)  Intake/Output from previous day: 11/04 0701 - 11/05 0700 In: 5450.8 [I.V.:4148.8; IV Piggyback:752] Out: 2850 [Urine:2750; Blood:100] Intake/Output this shift:   AAOx3 -  Moves all well , CN II-XII intact - ambulating to BR Wound:c/d/i  Lab Results: No results found for this basename: WBC:2,HGB:2,HCT:2,PLT:2 in the last 72 hours BMET No results found for this basename: NA:2,K:2,CL:2,CO2:2,GLUCOSE:2,BUN:2,CREATININE:2,CALCIUM:2 in the last 72 hours  Studies/Results: No results found.  Assessment/Plan: Increase act - Transfer to floor  LOS: 1 day     Memphis Creswell R, MD 09/12/2012, 8:36 AM

## 2012-09-12 NOTE — Progress Notes (Signed)
UR COMPLETED  

## 2012-09-12 NOTE — Progress Notes (Signed)
Report called , pt walked in hallway before leaving unit  And did very well, remains  neuro intact dsg back of neck intact

## 2012-09-12 NOTE — Progress Notes (Signed)
Pt alert and oriented up in chair dsg intact back of her head no complaints voiced

## 2012-09-13 MED ORDER — DICLOFENAC SODIUM 75 MG PO TBEC
75.0000 mg | DELAYED_RELEASE_TABLET | Freq: Two times a day (BID) | ORAL | Status: DC
  Administered 2012-09-13 – 2012-09-14 (×3): 75 mg via ORAL
  Filled 2012-09-13 (×6): qty 1

## 2012-09-13 MED ORDER — INFLUENZA VIRUS VACC SPLIT PF IM SUSP
0.5000 mL | INTRAMUSCULAR | Status: AC
  Administered 2012-09-14: 0.5 mL via INTRAMUSCULAR
  Filled 2012-09-13: qty 0.5

## 2012-09-13 NOTE — Progress Notes (Signed)
Pt dressing changed. Under no s/s distress. Currently stable.

## 2012-09-13 NOTE — Progress Notes (Signed)
Doing well. C/o appropriate incisional pain.  Amb/ voiding well  Temp:  [97.6 F (36.4 C)-98.2 F (36.8 C)] 98.1 F (36.7 C) (11/06 0600) Pulse Rate:  [53-68] 57  (11/06 0600) Resp:  [18] 18  (11/06 0600) BP: (116-132)/(64-79) 121/79 mmHg (11/06 0600) SpO2:  [92 %-98 %] 92 % (11/06 0600) Good strength and sensation Incision CDI  Plan: Increase activity - ? D/c in next 1-2 days

## 2012-09-14 MED ORDER — HYDROCODONE-ACETAMINOPHEN 5-325 MG PO TABS: 1.0000 | ORAL_TABLET | ORAL | Status: DC | PRN

## 2012-09-14 MED ORDER — PROMETHAZINE HCL 25 MG PO TABS: 12.5000 mg | ORAL_TABLET | ORAL | Status: DC | PRN

## 2012-09-14 MED ORDER — DICLOFENAC SODIUM 75 MG PO TBEC: 75.0000 mg | DELAYED_RELEASE_TABLET | Freq: Two times a day (BID) | ORAL | Status: DC | PRN

## 2012-09-14 MED ORDER — CYCLOBENZAPRINE HCL 10 MG PO TABS: 10.0000 mg | ORAL_TABLET | Freq: Three times a day (TID) | ORAL | Status: DC | PRN

## 2012-09-14 NOTE — Care Management Note (Signed)
    Page 1 of 1   09/14/2012     1:56:21 PM   CARE MANAGEMENT NOTE 09/14/2012  Patient:  Caroline Hopkins, Caroline Hopkins   Account Number:  [id number]  Date Initiated:  09/14/2012  Documentation initiated by:  Jacquelynn Cree  Subjective/Objective Assessment:   Admitted postop suboccipital craniectomy cervical laminectomy  for chiari malformation.     Action/Plan:   return home   Anticipated DC Date:  09/14/2012   Anticipated DC Plan:  HOME/SELF CARE      DC Planning Services  CM consult      Choice offered to / List presented to:             Status of service:  Completed, signed off Medicare Important Message given?   (If response is "NO", the following Medicare IM given date fields will be blank) Date Medicare IM given:   Date Additional Medicare IM given:    Discharge Disposition:  HOME/SELF CARE  Per UR Regulation:  Reviewed for med. necessity/level of care/duration of stay  If discussed at Long Length of Stay Meetings, dates discussed:    Comments:

## 2012-09-14 NOTE — Discharge Summary (Signed)
Physician Discharge Summary  Patient ID: Caroline Hopkins MRN: [id number] DOB/AGE: 1981/05/09 31 y.o.  Admit date: 09/11/2012 Discharge date: 09/14/2012  Admission Diagnoses:Chiari malformation   Discharge Diagnoses: Chiari malformation  Active Problems:  * No active hospital problems. *    Discharged Condition: good  Hospital Course: pt admitted on day of surgery - underwent procedure below - pt observed in ICU first night - did well, Transfered to floor - has been ambulating, eating, voiding - pain controlled - neuro intact  Consults: None  Significant Diagnostic Studies: none  Treatments: surgery: SUBOCCIPITAL CRANIECTOMY CERVICAL LAMINECTOMY/DURAPLASTY, microdisection   Discharge Exam: Blood pressure 113/68, pulse 59, temperature 98 F (36.7 C), temperature source Oral, resp. rate 18, height 5\' 5"  (1.651 m), weight 117.3 kg (258 lb 9.6 oz), last menstrual period 08/28/2012, SpO2 99.00%. Wound:c/d/i Disposition: home     Medication List     As of 09/14/2012  8:57 AM    STOP taking these medications         ibuprofen 200 MG tablet   Commonly known as: ADVIL,MOTRIN      TAKE these medications         citalopram 20 MG tablet   Commonly known as: CELEXA   Take 20 mg by mouth daily.      cyclobenzaprine 10 MG tablet   Commonly known as: FLEXERIL   Take 1 tablet (10 mg total) by mouth 3 (three) times daily as needed for muscle spasms.      diclofenac 75 MG EC tablet   Commonly known as: VOLTAREN   Take 1 tablet (75 mg total) by mouth 2 (two) times daily as needed (prn).      HYDROcodone-acetaminophen 5-325 MG per tablet   Commonly known as: NORCO/VICODIN   Take 1-2 tablets by mouth every 4 (four) hours as needed.      promethazine 25 MG tablet   Commonly known as: PHENERGAN   Take 0.5 tablets (12.5 mg total) by mouth every 4 (four) hours as needed for nausea.         SignedClydene Fake, MD 09/14/2012, 8:57 AM

## 2012-09-14 NOTE — Progress Notes (Signed)
Dc instructions provided. Pt verbalized understanding. Iv dc intact. Pt under no s/s distress. rx given to pt.

## 2015-09-26 ENCOUNTER — Ambulatory Visit: Payer: Self-pay | Admitting: Family Medicine

## 2015-10-30 ENCOUNTER — Encounter: Payer: Self-pay | Admitting: Family Medicine

## 2015-10-30 ENCOUNTER — Ambulatory Visit (INDEPENDENT_AMBULATORY_CARE_PROVIDER_SITE_OTHER): Payer: 59 | Admitting: Family Medicine

## 2015-10-30 VITALS — BP 123/88 | HR 72 | Temp 97.3°F | Ht 65.0 in | Wt 227.8 lb

## 2015-10-30 DIAGNOSIS — F418 Other specified anxiety disorders: Secondary | ICD-10-CM | POA: Diagnosis not present

## 2015-10-30 DIAGNOSIS — Z131 Encounter for screening for diabetes mellitus: Secondary | ICD-10-CM | POA: Diagnosis not present

## 2015-10-30 DIAGNOSIS — F419 Anxiety disorder, unspecified: Secondary | ICD-10-CM

## 2015-10-30 DIAGNOSIS — Z13220 Encounter for screening for lipoid disorders: Secondary | ICD-10-CM

## 2015-10-30 DIAGNOSIS — Q0700 Arnold-Chiari syndrome without spina bifida or hydrocephalus: Secondary | ICD-10-CM | POA: Insufficient documentation

## 2015-10-30 DIAGNOSIS — Z23 Encounter for immunization: Secondary | ICD-10-CM

## 2015-10-30 DIAGNOSIS — F329 Major depressive disorder, single episode, unspecified: Secondary | ICD-10-CM

## 2015-10-30 DIAGNOSIS — F339 Major depressive disorder, recurrent, unspecified: Secondary | ICD-10-CM | POA: Insufficient documentation

## 2015-10-30 DIAGNOSIS — F32A Depression, unspecified: Secondary | ICD-10-CM

## 2015-10-30 MED ORDER — BUPROPION HCL 75 MG PO TABS: 75.0000 mg | ORAL_TABLET | Freq: Two times a day (BID) | ORAL | Status: DC

## 2015-10-30 NOTE — Assessment & Plan Note (Signed)
Restart Wellbutrin and tests labs. We will see her back in one month.

## 2015-10-30 NOTE — Progress Notes (Signed)
BP 123/88 mmHg  Pulse 72  Temp(Src) 97.3 F (36.3 C) (Oral)  Ht _0  (1.651 m)  Wt 227 lb 12.8 oz (103.329 kg)  BMI 37.91 kg/m2   Subjective:    Patient ID: Caroline Hopkins, female    DOB: December 19, 1980, 34 y.o.   MRN: [id number]  HPI: Caroline Hopkins is a 34 y.o. female presenting on 10/30/2015 for Establish Care   HPI Anxiety and depression Patient comes in today as a new patient to establish with [place] for anxiety and depression. She has been on Wellbutrin previously at 75 mg twice a day. She has been doing well on this dose for at least a few months and like to go back on it. She denies any suicidal ideations. She does have some anxiety attacks but they are mostly controlled on this. She also likes it because it is helping with the smoking cessation.  Relevant past medical, surgical, family and social history reviewed and updated as indicated. Interim medical history since our last visit reviewed. Allergies and medications reviewed and updated.  Review of Systems  Constitutional: Negative for fever and chills.  HENT: Negative for congestion, ear discharge and ear pain.   Eyes: Negative for redness and visual disturbance.  Respiratory: Negative for chest tightness and shortness of breath.   Cardiovascular: Negative for chest pain and leg swelling.  Genitourinary: Negative for dysuria and difficulty urinating.  Musculoskeletal: Negative for back pain and gait problem.  Skin: Negative for rash.  Neurological: Negative for light-headedness and headaches.  Psychiatric/Behavioral: Positive for sleep disturbance, dysphoric mood and decreased concentration. Negative for suicidal ideas, behavioral problems, self-injury and agitation. The patient is nervous/anxious.   All other systems reviewed and are negative.   Per HPI unless specifically indicated above  Social History   Social History  . Marital Status: Married    Spouse Name: N/A  . Number of Children: N/A  . Years of  Education: N/A   Occupational History  . Not on file.   Social History Main Topics  . Smoking status: Current Every Day Smoker -- 1.00 packs/day for 15 years    Types: Cigarettes  . Smokeless tobacco: Never Used  . Alcohol Use: 1.2 oz/week    2 Glasses of wine per week     Comment: 1-2 times per month  . Drug Use: No  . Sexual Activity: Yes    Birth Control/ Protection: None     Comment: boyfriend for 3 years   Other Topics Concern  . Not on file   Social History Narrative    Past Surgical History  Procedure Laterality Date  . Cesarean section  1999 and 2001  . Fracture surgery      left ankle repair x3  . Suboccipital craniectomy cervical laminectomy  09/11/2012    Procedure: SUBOCCIPITAL CRANIECTOMY CERVICAL LAMINECTOMY/DURAPLASTY;  Surgeon: Otilio Connors, MD;  Location: [facility];  Service: Neurosurgery;  Laterality: N/A;  Chiari decompression/Suboccipital craniectomy with Cervical one laminectomy/dural patch graft  . Tubal ligation  2001    Family History  Problem Relation Age of Onset  . Arthritis Mother   . Cancer Mother   . Depression Mother   . Arthritis Maternal Grandmother   . Cancer Maternal Grandmother   . Heart disease Maternal Grandmother   . Hypertension Maternal Grandmother       Medication List       This list is accurate as of: 10/30/15  9:58 AM.  Always use your most recent  med list.               buPROPion 75 MG tablet  Commonly known as:  WELLBUTRIN  Take 1 tablet (75 mg total) by mouth 2 (two) times daily.     diphenhydrAMINE 25 mg capsule  Commonly known as:  BENADRYL  Take 25 mg by mouth as needed.     ibuprofen 400 MG tablet  Commonly known as:  ADVIL,MOTRIN  Take 400 mg by mouth as needed.           Objective:    BP 123/88 mmHg  Pulse 72  Temp(Src) 97.3 F (36.3 C) (Oral)  Ht _0  (1.651 m)  Wt 227 lb 12.8 oz (103.329 kg)  BMI 37.91 kg/m2  Wt Readings from Last 3 Encounters:  10/30/15 227 lb 12.8 oz (103.329  kg)  09/11/12 258 lb 9.6 oz (117.3 kg)  08/31/12 253 lb 4.8 oz (114.896 kg)    Physical Exam  Constitutional: She is oriented to person, place, and time. She appears well-developed and well-nourished. No distress.  Eyes: Conjunctivae and EOM are normal. Pupils are equal, round, and reactive to light.  Cardiovascular: Normal rate, regular rhythm, normal heart sounds and intact distal pulses.   No murmur heard. Pulmonary/Chest: Effort normal and breath sounds normal. No respiratory distress. She has no wheezes.  Musculoskeletal: Normal range of motion. She exhibits no edema or tenderness.  Neurological: She is alert and oriented to person, place, and time. Coordination normal.  Skin: Skin is warm and dry. No rash noted. She is not diaphoretic.  Psychiatric: Her speech is normal and behavior is normal. Judgment and thought content normal. Her mood appears anxious. Her affect is not angry, not blunt and not labile. She exhibits a depressed mood. She expresses no suicidal ideation. She expresses no suicidal plans.  Nursing note and vitals reviewed.   Results for orders placed or performed during the hospital encounter of 09/11/12  Protime-INR  Result Value Ref Range   Prothrombin Time 13.6 11.6 - 15.2 seconds   INR 1.05 0.00 - 1.49  APTT  Result Value Ref Range   aPTT 33 24 - 37 seconds      Assessment & Plan:   Problem List Items Addressed This Visit      Other   Anxiety and depression    Restart Wellbutrin and tests labs. We will see her back in one month.      Relevant Medications   buPROPion (WELLBUTRIN) 75 MG tablet   Other Relevant Orders   TSH   VITAMIN D 25 Hydroxy (Vit-D Deficiency, Fractures)   CBC with Differential/Platelet    Other Visit Diagnoses    Screening for diabetes mellitus (DM)    -  Primary    Relevant Orders    CMP14+EGFR    Screening for lipid disorders        Relevant Orders    Lipid panel        Follow up plan: Return in about 4 weeks (around  11/27/2015), or if symptoms worsen or fail to improve, for anxiety and depression.  Caryl Pina, MD [facility] 10/30/2015, 9:58 AM

## 2015-10-31 LAB — CMP14+EGFR
ALT: 17 IU/L (ref 0–32)
AST: 14 IU/L (ref 0–40)
Albumin/Globulin Ratio: 1.5 (ref 1.1–2.5)
Albumin: 4.4 g/dL (ref 3.5–5.5)
Alkaline Phosphatase: 51 IU/L (ref 39–117)
BUN/Creatinine Ratio: 16 (ref 8–20)
BUN: 11 mg/dL (ref 6–20)
Bilirubin Total: 0.6 mg/dL (ref 0.0–1.2)
CO2: 22 mmol/L (ref 18–29)
Calcium: 9.7 mg/dL (ref 8.7–10.2)
Chloride: 103 mmol/L (ref 96–106)
Creatinine, Ser: 0.7 mg/dL (ref 0.57–1.00)
GFR calc Af Amer: 130 mL/min/{1.73_m2} (ref >59)
GFR calc non Af Amer: 113 mL/min/{1.73_m2} (ref >59)
Globulin, Total: 2.9 g/dL (ref 1.5–4.5)
Glucose: 79 mg/dL (ref 65–99)
Potassium: 4.2 mmol/L (ref 3.5–5.2)
Sodium: 144 mmol/L (ref 134–144)
Total Protein: 7.3 g/dL (ref 6.0–8.5)

## 2015-10-31 LAB — CBC WITH DIFFERENTIAL/PLATELET
Basophils Absolute: 0 10*3/uL (ref 0.0–0.2)
Basos: 0 %
EOS (ABSOLUTE): 0.3 10*3/uL (ref 0.0–0.4)
Eos: 3 %
Hematocrit: 42.3 % (ref 34.0–46.6)
Hemoglobin: 14.5 g/dL (ref 11.1–15.9)
Immature Grans (Abs): 0 10*3/uL (ref 0.0–0.1)
Immature Granulocytes: 0 %
Lymphocytes Absolute: 2.1 10*3/uL (ref 0.7–3.1)
Lymphs: 24 %
MCH: 31.1 pg (ref 26.6–33.0)
MCHC: 34.3 g/dL (ref 31.5–35.7)
MCV: 91 fL (ref 79–97)
Monocytes Absolute: 0.4 10*3/uL (ref 0.1–0.9)
Monocytes: 4 %
Neutrophils Absolute: 6.1 10*3/uL (ref 1.4–7.0)
Neutrophils: 69 %
Platelets: 268 10*3/uL (ref 150–379)
RBC: 4.66 x10E6/uL (ref 3.77–5.28)
RDW: 13.8 % (ref 12.3–15.4)
WBC: 9 10*3/uL (ref 3.4–10.8)

## 2015-10-31 LAB — TSH: TSH: 0.909 u[IU]/mL (ref 0.450–4.500)

## 2015-10-31 LAB — LIPID PANEL
Chol/HDL Ratio: 3.3 ratio units (ref 0.0–4.4)
Cholesterol, Total: 155 mg/dL (ref 100–199)
HDL: 47 mg/dL (ref >39)
LDL Calculated: 90 mg/dL (ref 0–99)
Triglycerides: 91 mg/dL (ref 0–149)
VLDL Cholesterol Cal: 18 mg/dL (ref 5–40)

## 2015-10-31 LAB — VITAMIN D 25 HYDROXY (VIT D DEFICIENCY, FRACTURES): Vit D, 25-Hydroxy: 14.4 ng/mL — ABNORMAL LOW (ref 30.0–100.0)

## 2015-11-12 ENCOUNTER — Encounter: Payer: Self-pay | Admitting: Family Medicine

## 2015-11-14 ENCOUNTER — Encounter: Payer: Self-pay | Admitting: Family Medicine

## 2015-11-18 ENCOUNTER — Ambulatory Visit (INDEPENDENT_AMBULATORY_CARE_PROVIDER_SITE_OTHER): Payer: 59 | Admitting: *Deleted

## 2015-11-18 DIAGNOSIS — Z111 Encounter for screening for respiratory tuberculosis: Secondary | ICD-10-CM | POA: Diagnosis not present

## 2015-11-18 NOTE — Progress Notes (Signed)
Patient came in today for a TB skin test for work. Tb skin test place on left forearm. Patient will return Thursday after 3:25pm or Friday before 3:25pm.

## 2015-11-20 LAB — TB SKIN TEST
Induration: 0 mm
TB Skin Test: NEGATIVE

## 2015-11-24 ENCOUNTER — Telehealth: Payer: Self-pay | Admitting: Family Medicine

## 2015-11-24 NOTE — Telephone Encounter (Signed)
FYI

## 2015-11-24 NOTE — Telephone Encounter (Signed)
Thanks for letting me now.

## 2015-11-27 ENCOUNTER — Ambulatory Visit: Payer: 59 | Admitting: Family Medicine

## 2015-12-22 ENCOUNTER — Encounter: Payer: Self-pay | Admitting: Family Medicine

## 2015-12-22 DIAGNOSIS — F329 Major depressive disorder, single episode, unspecified: Secondary | ICD-10-CM

## 2015-12-22 DIAGNOSIS — F419 Anxiety disorder, unspecified: Principal | ICD-10-CM

## 2015-12-22 DIAGNOSIS — F32A Depression, unspecified: Secondary | ICD-10-CM

## 2015-12-22 MED ORDER — BUPROPION HCL 75 MG PO TABS: 75.0000 mg | ORAL_TABLET | Freq: Two times a day (BID) | ORAL | Status: DC

## 2015-12-29 ENCOUNTER — Ambulatory Visit: Payer: 59 | Admitting: Family Medicine

## 2016-01-02 ENCOUNTER — Ambulatory Visit (INDEPENDENT_AMBULATORY_CARE_PROVIDER_SITE_OTHER): Payer: 59 | Admitting: Family Medicine

## 2016-01-02 ENCOUNTER — Encounter: Payer: Self-pay | Admitting: Family Medicine

## 2016-01-02 VITALS — BP 119/81 | HR 59 | Temp 98.5°F | Ht 65.0 in | Wt 229.4 lb

## 2016-01-02 DIAGNOSIS — B070 Plantar wart: Secondary | ICD-10-CM

## 2016-01-02 DIAGNOSIS — F32A Depression, unspecified: Secondary | ICD-10-CM

## 2016-01-02 DIAGNOSIS — K219 Gastro-esophageal reflux disease without esophagitis: Secondary | ICD-10-CM

## 2016-01-02 DIAGNOSIS — F419 Anxiety disorder, unspecified: Principal | ICD-10-CM

## 2016-01-02 DIAGNOSIS — F329 Major depressive disorder, single episode, unspecified: Secondary | ICD-10-CM

## 2016-01-02 DIAGNOSIS — F418 Other specified anxiety disorders: Secondary | ICD-10-CM

## 2016-01-02 MED ORDER — BUPROPION HCL 75 MG PO TABS: 75.0000 mg | ORAL_TABLET | Freq: Two times a day (BID) | ORAL | Status: DC

## 2016-01-02 MED ORDER — OMEPRAZOLE 20 MG PO CPDR: 20.0000 mg | DELAYED_RELEASE_CAPSULE | Freq: Every day | ORAL | Status: DC

## 2016-01-02 NOTE — Progress Notes (Signed)
BP 119/81 mmHg  Pulse 59  Temp(Src) 98.5 F (36.9 C) (Oral)  Ht  (1.651 m)  Wt 229 lb 6.4 oz (104.055 kg)  BMI 38.17 kg/m2   Subjective:    Patient ID: Caroline Hopkins, female    DOB: 1981-02-08, 35 y.o.   MRN: [id number]  HPI: Caroline Hopkins is a 35 y.o. female presenting on 01/02/2016 for Anxiety & Depression followup; Painful spot on bottom of left foot; and Gastroesophageal Reflux   HPI Anxiety and depression follow-up Patient comes in today for anxiety and depression follow-up. She has been taking Wellbutrin 75 mg only in the morning for the past 3 weeks. She feels like it is helping some but is not lasting through the day. Her prescription was initially prescribed for 75 mg twice a day and she would like to start trying to take it like that. She denies any suicidal ideations or thoughts of hurting herself. She feels like it does well for her throughout the early day but in the evening and afternoon it doesn't last. She has feelings of anxiousness and some feelings of sadness but much less than she was having previously.  GERD Patient has been on omeprazole previously in her life but then she has been buying omeprazole over-the-counter 10 mg and feels like it is not working well for her. She would like a prescription strength for the omeprazole 20 mg. She has a little bit of indigestion and belching and then acid and heartburn going up into her chest. She denies any nausea or vomiting or blood in her stool or blood in vomit.  Painful spot on the bottom of her foot She's had a painful spot on the bottom of her foot for the past year but is been worsening over the past couple weeks. She thought it might be a wart but has really had it checked out and looked at in sometime. It is just painful when she steps on it. There is no surrounding erythema or warmth or skin discoloration. It is starting to develop a little worn coming off the end of it.  Relevant past medical, surgical,  family and social history reviewed and updated as indicated. Interim medical history since our last visit reviewed. Allergies and medications reviewed and updated.  Review of Systems  Constitutional: Negative for fever and chills.  HENT: Negative for congestion, ear discharge and ear pain.   Eyes: Negative for redness and visual disturbance.  Respiratory: Negative for chest tightness and shortness of breath.   Cardiovascular: Negative for chest pain and leg swelling.  Gastrointestinal: Positive for abdominal pain. Negative for nausea, vomiting, diarrhea, constipation and blood in stool.  Genitourinary: Negative for dysuria and difficulty urinating.  Musculoskeletal: Negative for back pain and gait problem.  Skin: Positive for rash.  Neurological: Negative for light-headedness and headaches.  Psychiatric/Behavioral: Positive for dysphoric mood. Negative for suicidal ideas, behavioral problems, sleep disturbance, self-injury and agitation. The patient is nervous/anxious.   All other systems reviewed and are negative.   Per HPI unless specifically indicated above     Medication List       This list is accurate as of: 01/02/16  8:15 AM.  Always use your most recent med list.               buPROPion 75 MG tablet  Commonly known as:  WELLBUTRIN  Take 1 tablet (75 mg total) by mouth 2 (two) times daily.     diphenhydrAMINE 25 mg capsule  Commonly known as:  BENADRYL  Take 25 mg by mouth as needed.     ibuprofen 400 MG tablet  Commonly known as:  ADVIL,MOTRIN  Take 400 mg by mouth as needed.     omeprazole 20 MG capsule  Commonly known as:  PRILOSEC  Take 1 capsule (20 mg total) by mouth daily.           Objective:    BP 119/81 mmHg  Pulse 59  Temp(Src) 98.5 F (36.9 C) (Oral)  Ht  (1.651 m)  Wt 229 lb 6.4 oz (104.055 kg)  BMI 38.17 kg/m2  Wt Readings from Last 3 Encounters:  01/02/16 229 lb 6.4 oz (104.055 kg)  10/30/15 227 lb 12.8 oz (103.329 kg)  09/11/12  258 lb 9.6 oz (117.3 kg)    Physical Exam  Constitutional: She is oriented to person, place, and time. She appears well-developed and well-nourished. No distress.  Eyes: Conjunctivae and EOM are normal. Pupils are equal, round, and reactive to light.  Cardiovascular: Normal rate, regular rhythm, normal heart sounds and intact distal pulses.   No murmur heard. Pulmonary/Chest: Effort normal and breath sounds normal. No respiratory distress. She has no wheezes.  Abdominal: Soft. Normal appearance and bowel sounds are normal. There is tenderness (mild epigastric discomfort) in the epigastric area. There is no rigidity, no rebound, no guarding and no CVA tenderness.  Musculoskeletal: Normal range of motion. She exhibits no edema or tenderness.  Neurological: She is alert and oriented to person, place, and time. Coordination normal.  Skin: Skin is warm and dry. Lesion (Small plantar warts on the posterior aspect of her left sole that has a small horn like projection.) noted. No rash noted. She is not diaphoretic.  Psychiatric: Her speech is normal and behavior is normal. Judgment and thought content normal. Her mood appears anxious. She exhibits a depressed mood. She expresses no suicidal ideation. She expresses no suicidal plans.  Nursing note and vitals reviewed.   Results for orders placed or performed in visit on 11/18/15  TB Skin Test  Result Value Ref Range   TB Skin Test Negative    Induration 0 mm   Cryotherapy: Single plantar wart on the basis of her left foot. Applied cryotherapy for destruction using liquid nitrogen gun. Apply 20 seconds 3. Patient tolerated well. No bleeding    Assessment & Plan:   Problem List Items Addressed This Visit      Digestive   GERD (gastroesophageal reflux disease)   Relevant Medications   omeprazole (PRILOSEC) 20 MG capsule     Other   Anxiety and depression - Primary   Relevant Medications   buPROPion (WELLBUTRIN) 75 MG tablet    Other Visit  Diagnoses    Plantar wart, left foot            Follow up plan: Return in about 8 weeks (around 02/27/2016), or if symptoms worsen or fail to improve, for Follow-up depression and anxiety.  Counseling provided for all of the vaccine components No orders of the defined types were placed in this encounter.    Arville Care, MD [facility] 01/02/2016, 8:15 AM

## 2016-02-27 ENCOUNTER — Ambulatory Visit: Payer: 59 | Admitting: Gynecology

## 2016-02-27 ENCOUNTER — Ambulatory Visit (INDEPENDENT_AMBULATORY_CARE_PROVIDER_SITE_OTHER): Payer: 59 | Admitting: Gynecology

## 2016-02-27 ENCOUNTER — Ambulatory Visit: Payer: 59 | Admitting: Family Medicine

## 2016-02-27 ENCOUNTER — Encounter: Payer: Self-pay | Admitting: Gynecology

## 2016-02-27 VITALS — BP 128/86 | Ht 64.5 in | Wt 222.0 lb

## 2016-02-27 DIAGNOSIS — N920 Excessive and frequent menstruation with regular cycle: Secondary | ICD-10-CM | POA: Diagnosis not present

## 2016-02-27 DIAGNOSIS — Z8639 Personal history of other endocrine, nutritional and metabolic disease: Secondary | ICD-10-CM | POA: Diagnosis not present

## 2016-02-27 DIAGNOSIS — N946 Dysmenorrhea, unspecified: Secondary | ICD-10-CM | POA: Diagnosis not present

## 2016-02-27 DIAGNOSIS — N941 Unspecified dyspareunia: Secondary | ICD-10-CM

## 2016-02-27 DIAGNOSIS — Z01419 Encounter for gynecological examination (general) (routine) without abnormal findings: Secondary | ICD-10-CM | POA: Diagnosis not present

## 2016-02-27 MED ORDER — VITAMIN D (ERGOCALCIFEROL) 1.25 MG (50000 UNIT) PO CAPS: 50000.0000 [IU] | ORAL_CAPSULE | ORAL | Status: DC

## 2016-02-27 NOTE — Progress Notes (Addendum)
Caroline Hopkins [birth year] [id number]   History:    35 y.o.  for annual gyn exam who is a new patient to the practice. Patient was previously being followed by Dr. Mardella Layman. [place]. She brought notes with her and several years ago patient had a LEEP cervical conization for dysplasia and subsequent Pap smear were normal to include last year. She's had 2 cesarean section and with the last cesarean section she's had a tubal ligation. She does smoke a pack cigarette per day. She is contemplating whether have a tubal reversal or not. She is suffering from the menstrual cycles has been unable to take oral contraceptive pills in the past because of side effects. Her cycles lasting time between 5-15 days. She also suffers from dysmenorrhea and occasional dyspareunia. Review of her record indicated that When she had her annual exam with her PCP in December 2016 her vitamin D level was 14.4. She has complained of feeling tired and fatigued and rundown. Partner documentation that she brought with her indicated that she had a normal pelvic ultrasound in July 2016.  Past medical history,surgical history, family history and social history were all reviewed and documented in the EPIC chart.  Gynecologic History Patient's last menstrual period was 01/23/2016. Contraception: tubal ligation Last Pap: 2016. Results were: normal Last mammogram: 2011. Results were: normal  Obstetric History OB History  Gravida Para Term Preterm AB SAB TAB Ectopic Multiple Living  # Outcome Date GA Lbr Len/2nd Weight Sex Delivery Anes PTL Lv  2 Para           1 Para                ROS: A ROS was performed and pertinent positives and negatives are included in the history.  GENERAL: No fevers or chills. HEENT: No change in vision, no earache, sore throat or sinus congestion. NECK: No pain or stiffness. CARDIOVASCULAR: No chest pain or pressure. No palpitations. PULMONARY: No shortness of breath, cough or wheeze.  GASTROINTESTINAL: No abdominal pain, nausea, vomiting or diarrhea, melena or bright red blood per rectum. GENITOURINARY: No urinary frequency, urgency, hesitancy or dysuria. MUSCULOSKELETAL: No joint or muscle pain, no back pain, no recent trauma. DERMATOLOGIC: No rash, no itching, no lesions. ENDOCRINE: No polyuria, polydipsia, no heat or cold intolerance. No recent change in weight. HEMATOLOGICAL: No anemia or easy bruising or bleeding. NEUROLOGIC: No headache, seizures, numbness, tingling or weakness. PSYCHIATRIC: No depression, no loss of interest in normal activity or change in sleep pattern.     Exam: chaperone present  BP 128/86 mmHg  Ht 5' 4.5" (1.638 m)  Wt 222 lb (100.699 kg)  BMI 37.53 kg/m2  LMP 01/23/2016  Body mass index is 37.53 kg/(m^2).  General appearance : Well developed well nourished female. No acute distress HEENT: Eyes: no retinal hemorrhage or exudates,  Neck supple, trachea midline, no carotid bruits, no thyroidmegaly Lungs: Clear to auscultation, no rhonchi or wheezes, or rib retractions  Heart: Regular rate and rhythm, no murmurs or gallops Breast:Examined in sitting and supine position were symmetrical in appearance, no palpable masses or tenderness,  no skin retraction, no nipple inversion, no nipple discharge, no skin discoloration, no axillary or supraclavicular lymphadenopathy Abdomen: no palpable masses or tenderness, no rebound or guarding Extremities: no edema or skin discoloration or tenderness  Pelvic:  Bartholin, Urethra, Skene Glands: Within normal limits  Vagina: No gross lesions or discharge  Cervix: No gross lesions or discharge  Uterus  anteverted, normal size, shape and consistency, non-tender and mobile  Adnexa  Without masses or tenderness  Anus and perineum  normal   Rectovaginal  normal sphincter tone without palpated masses or tenderness             Hemoccult not indicated     Assessment/Plan:  35 y.o. female for annual  exam who is a new patient to the practice was found to have vitamin D deficiency which could be contributing to her symptoms. She will be prescribed vitamin D 50,000 units to take one by mouth every weekly and after 3 months return to the office to check her vitamin D level. After that she will take vitamin D 2000 units daily. I'm going to recommend placing a Mirena IUD not as much for contraception but for her heavy cycles and cramping. Her occasional dyspareunia may be attributed to underlying endometriosis which the Mirena IUD will help as well. Literature information provided and the Mirena IUD. We discussed importance of monthly breast exams. We discussed the detrimental effects of smoking. Pap smear with HPV screening was done today.  Ok EdwardsFERNANDEZ,JUAN H MD, 12:48 PM 02/27/2016

## 2016-02-27 NOTE — Patient Instructions (Addendum)
In Vitro Fertilization  In vitro fertilization (IVF) is a type of assisted reproductive technology. IVF involves a series of procedures used to treat fertility or genetic problems in order to assist with the conception of a baby. During IVF, eggs are retrieved from the ovaries and combined with sperm in a lab to fertilize the eggs. One or more of the fertilized eggs (embryos) are inserted into the uterus through the cervix. Candidates for IVF include:  · People who are infertile.  · Women who underwent premature menopause or ovarian failure.  · Women who had both ovaries removed. In this case, donor eggs must be used.  · Women who have damaged or blocked fallopian tubes.  There is no age limit for having IVF, but it is not recommended for postmenopausal women. The ideal age for IVF is 35 years old or younger. Women age 41 and older are often counseled to consider using donor eggs during IVF to increase the chances of success.  LET YOUR HEALTH CARE PROVIDER KNOW ABOUT:   · Any allergies you have.  · All medicines you are taking, including vitamins, herbs, eye drops, creams, and over-the-counter medicines.  · Previous problems you or members of your family have had with the use of anesthetics.  · Any medical or genetic problems that you or members of your family have had.  · Any blood disorders you have.  · Previous surgeries you have had.  · Previous pregnancies you have had.  · Medical conditions you have.  · Any excessive alcohol drinking or smoking you have done.  · Any history of taking illegal drugs.  RISKS AND COMPLICATIONS   Generally, IVF procedures are safe. However, as with any procedure, complications can occur. Possible complications include:  · Bleeding and infection.  · Problems with anesthesia.  · Blood clots.  · The procedure not working.  · Having twins or multiples.  · Increased risk of early delivery.  BEFORE THE PROCEDURE   Before beginning a cycle of IVF, you and the donor may need various  screenings to make sure IVF is the correct treatment option. Some people may not benefit from this type of assisted reproductive technology.   · You and the donor will need to provide a complete medical history and the medical history of your families.  · You and the donor will undergo a physical exam.  · You and the donor may need blood tests to check for infectious diseases, including HIV.  · Other tests that may be done include:  ¨ Testing of your ovaries to determine the quality and quantity of your eggs.   ¨ Hormone tests and ovulation testing.  ¨ An exam of your uterus. This may be done by using ultrasound after liquid is injected into your uterus through your cervix (sonohysterography) or by using a thin, flexible tube with a tiny light and camera on the end of it (hysteroscope).  ¨ The donor's sperm will be taken and analyzed to see if they are normal, there are enough present to fertilize the egg, and they act normally after sexual intercourse (postcoital exam).  PROCEDURE   IVF involves several steps. These procedures can be done at the health care provider's office or clinic. One cycle of IVF can take about 2 weeks, and more than one cycle may be required. The steps of IVF are:  · Ovarian stimulation. If using your own eggs during IVF, at the start of a cycle you will begin treatment with man-made (synthetic) hormones.   ultrasound images as a guide, your health care provider will insert a thin needle through your vagina and into the ovary and sacs (follicles) containing the eggs. The needle is connected to a suction device, which pulls the eggs and fluid out of each follicle, one at a time. The procedure is repeated for the other ovary.  Insemination and  fertilization. The sperm is mixed together with your eggs (insemination) and stored in an environmentally controlled chamber.The sperm usually enters (fertilizes) an egg a few hours after insemination.  Embryo transfer. Your health care provider will place the embryos into your uterus using a thin tube (catheter) containing the embryos. The catheter is inserted into your vagina, through your cervix, and into your uterus. The embryo transfer usually takes place 2-6 days after the egg retrieval. If successful, the embryo will stick to (implant) in the lining of your uterus about 6-10 days after egg retrieval. If an embryo implants in the lining of the uterus and grows, pregnancy results. AFTER THE PROCEDURE   You may have to continue lying down for an hour or more before going home.  You may need to continue to take hormone therapy for about 3 months or as directed by your health care provider.  You may resume your normal diet and activities.   This information is not intended to replace advice given to you by your health care provider. Make sure you discuss any questions you have with your health care provider.   Document Released: 10/07/2008 Document Revised: 06/27/2013 Document Reviewed: 05/03/2013 Elsevier Interactive Patient Education 2016 [employer] Levonorgestrel intrauterine device (IUD) What is this medicine? LEVONORGESTREL IUD (LEE voe nor jes trel) is a contraceptive (birth control) device. The device is placed inside the uterus by a healthcare professional. It is used to prevent pregnancy and can also be used to treat heavy bleeding that occurs during your period. Depending on the device, it can be used for 3 to 5 years. This medicine may be used for other purposes; ask your health care provider or pharmacist if you have questions. What should I tell my health care provider before I take this medicine? They need to know if you have any of these conditions: -abnormal Pap  smear -cancer of the breast, uterus, or cervix -diabetes -endometritis -genital or pelvic infection now or in the past -have more than one sexual partner or your partner has more than one partner -heart disease -history of an ectopic or tubal pregnancy -immune system problems -IUD in place -liver disease or tumor -problems with blood clots or take blood-thinners -use intravenous drugs -uterus of unusual shape -vaginal bleeding that has not been explained -an unusual or allergic reaction to levonorgestrel, other hormones, silicone, or polyethylene, medicines, foods, dyes, or preservatives -pregnant or trying to get pregnant -breast-feeding How should I use this medicine? This device is placed inside the uterus by a health care professional. Talk to your pediatrician regarding the use of this medicine in children. Special care may be needed. Overdosage: If you think you have taken too much of this medicine contact a poison control center or emergency room at once. NOTE: This medicine is only for you. Do not share this medicine with others. What if I miss a dose? This does not apply. What may interact with this medicine? Do not take this medicine with any of the following medications: -amprenavir -bosentan -fosamprenavir This medicine may also interact with the following medications: -aprepitant -barbiturate medicines for inducing sleep or treating seizures -bexarotene -griseofulvin -  medicines to treat seizures like carbamazepine, ethotoin, felbamate, oxcarbazepine, phenytoin, topiramate -modafinil -pioglitazone -rifabutin -rifampin -rifapentine -some medicines to treat HIV infection like atazanavir, indinavir, lopinavir, nelfinavir, tipranavir, ritonavir -St. John's wort -warfarin This list may not describe all possible interactions. Give your health care provider a list of all the medicines, herbs, non-prescription drugs, or dietary supplements you use. Also tell them if  you smoke, drink alcohol, or use illegal drugs. Some items may interact with your medicine. What should I watch for while using this medicine? Visit your doctor or health care professional for regular check ups. See your doctor if you or your partner has sexual contact with others, becomes HIV positive, or gets a sexual transmitted disease. This product does not protect you against HIV infection (AIDS) or other sexually transmitted diseases. You can check the placement of the IUD yourself by reaching up to the top of your vagina with clean fingers to feel the threads. Do not pull on the threads. It is a good habit to check placement after each menstrual period. Call your doctor right away if you feel more of the IUD than just the threads or if you cannot feel the threads at all. The IUD may come out by itself. You may become pregnant if the device comes out. If you notice that the IUD has come out use a backup birth control method like condoms and call your health care provider. Using tampons will not change the position of the IUD and are okay to use during your period. What side effects may I notice from receiving this medicine? Side effects that you should report to your doctor or health care professional as soon as possible: -allergic reactions like skin rash, itching or hives, swelling of the face, lips, or tongue -fever, flu-like symptoms -genital sores -high blood pressure -no menstrual period for 6 weeks during use -pain, swelling, warmth in the leg -pelvic pain or tenderness -severe or sudden headache -signs of pregnancy -stomach cramping -sudden shortness of breath -trouble with balance, talking, or walking -unusual vaginal bleeding, discharge -yellowing of the eyes or skin Side effects that usually do not require medical attention (report to your doctor or health care professional if they continue or are bothersome): -acne -breast pain -change in sex drive or  performance -changes in weight -cramping, dizziness, or faintness while the device is being inserted -headache -irregular menstrual bleeding within first 3 to 6 months of use -nausea This list may not describe all possible side effects. Call your doctor for medical advice about side effects. You may report side effects to FDA at 1-800-FDA-1088. Where should I keep my medicine? This does not apply. NOTE: This sheet is a summary. It may not cover all possible information. If you have questions about this medicine, talk to your doctor, pharmacist, or health care provider.    2016, Elsevier/Gold Standard. (2011-11-25 13:54:04) Vitamin D Deficiency Vitamin D deficiency is when your body does not have enough vitamin D. Vitamin D is important to your body for many reasons:  It helps the body to absorb two important minerals, called calcium and phosphorus.  It plays a role in bone health.  It may help to prevent some diseases, such as diabetes and multiple sclerosis.  It plays a role in muscle function, including heart function. You can get vitamin D by:  Eating foods that naturally contain vitamin D.  Eating or drinking milk or other dairy products that have vitamin D added to them.  Taking a vitamin D  supplement or a multivitamin supplement that contains vitamin D.  Being in the sun. Your body naturally makes vitamin D when your skin is exposed to sunlight. Your body changes the sunlight into a form of the vitamin that the body can use. If vitamin D deficiency is severe, it can cause a condition in which your bones become soft. In adults, this condition is called osteomalacia. In children, this condition is called rickets. CAUSES Vitamin D deficiency may be caused by:  Not eating enough foods that contain vitamin D.  Not getting enough sun exposure.  Having certain digestive system diseases that make it difficult for your body to absorb vitamin D. These diseases include Crohn  disease, chronic pancreatitis, and cystic fibrosis.  Having a surgery in which a part of the stomach or a part of the small intestine is removed.  Being obese.  Having chronic kidney disease or liver disease. RISK FACTORS This condition is more likely to develop in:  Older people.  People who do not spend much time outdoors.  People who live in a long-term care facility.  People who have had broken bones.  People with weak or thin bones (osteoporosis).  People who have a disease or condition that changes how the body absorbs vitamin D.  People who have dark skin.  People who take certain medicines, such as steroid medicines or certain seizure medicines.  People who are overweight or obese. SYMPTOMS In mild cases of vitamin D deficiency, there may not be any symptoms. If the condition is severe, symptoms may include:  Bone pain.  Muscle pain.  Falling often.  Broken bones caused by a minor injury. DIAGNOSIS This condition is usually diagnosed with a blood test.  TREATMENT Treatment for this condition may depend on what caused the condition. Treatment options include:  Taking vitamin D supplements.  Taking a calcium supplement. Your health care provider will suggest what dose is best for you. HOME CARE INSTRUCTIONS  Take medicines and supplements only as told by your health care provider.  Eat foods that contain vitamin D. Choices include:  Fortified dairy products, cereals, or juices. Fortified means that vitamin D has been added to the food. Check the label on the package to be sure.  Fatty fish, such as salmon or trout.  Eggs.  Oysters.  Do not use a tanning bed.  Maintain a healthy weight. Lose weight, if needed.  Keep all follow-up visits as told by your health care provider. This is important. SEEK MEDICAL CARE IF:  Your symptoms do not go away.  You feel like throwing up (nausea) or you throw up (vomit).  You have fewer bowel movements than  usual or it is difficult for you to have a bowel movement (constipation).   This information is not intended to replace advice given to you by your health care provider. Make sure you discuss any questions you have with your health care provider.   Document Released: 01/17/2012 Document Revised: 07/16/2015 Document Reviewed: 03/12/2015 Elsevier Interactive Patient Education [employer]

## 2016-03-02 ENCOUNTER — Telehealth: Payer: Self-pay | Admitting: Gynecology

## 2016-03-02 NOTE — Telephone Encounter (Signed)
03/02/16- I LM VM for pt to let [facility] know [facility] Mirena for contraception at 100%, no copay. She was advised this needs to be done during cycle. [email or web address] -Ref#[id number]

## 2016-03-03 LAB — PAP, TP IMAGING W/ HPV RNA, RFLX HPV TYPE 16,18/45: HPV mRNA, High Risk: NOT DETECTED

## 2016-04-14 ENCOUNTER — Encounter: Payer: Self-pay | Admitting: Family

## 2016-04-14 ENCOUNTER — Ambulatory Visit (INDEPENDENT_AMBULATORY_CARE_PROVIDER_SITE_OTHER): Payer: 59 | Admitting: Family

## 2016-04-14 ENCOUNTER — Ambulatory Visit: Payer: 59 | Admitting: Physician Assistant

## 2016-04-14 VITALS — BP 123/88 | HR 66 | Temp 97.3°F | Ht 64.5 in | Wt 220.4 lb

## 2016-04-14 DIAGNOSIS — R319 Hematuria, unspecified: Secondary | ICD-10-CM | POA: Diagnosis not present

## 2016-04-14 DIAGNOSIS — R109 Unspecified abdominal pain: Secondary | ICD-10-CM

## 2016-04-14 LAB — URINALYSIS, COMPLETE
Bilirubin, UA: NEGATIVE
Glucose, UA: NEGATIVE
Ketones, UA: NEGATIVE
Leukocytes, UA: NEGATIVE
Nitrite, UA: NEGATIVE
Protein, UA: NEGATIVE
Specific Gravity, UA: 1.015 (ref 1.005–1.030)
Urobilinogen, Ur: 0.2 mg/dL (ref 0.2–1.0)
pH, UA: 6 (ref 5.0–7.5)

## 2016-04-14 LAB — MICROSCOPIC EXAMINATION

## 2016-04-14 MED ORDER — KETOROLAC TROMETHAMINE 60 MG/2ML IM SOLN
60.0000 mg | Freq: Once | INTRAMUSCULAR | Status: AC
  Administered 2016-04-14: 60 mg via INTRAMUSCULAR

## 2016-04-14 NOTE — Patient Instructions (Signed)
Kidney Stones °Kidney stones (urolithiasis) are deposits that form inside your kidneys. The intense pain is caused by the stone moving through the urinary tract. When the stone moves, the ureter goes into spasm around the stone. The stone is usually passed in the urine.  °CAUSES  °· A disorder that makes certain neck glands produce too much parathyroid hormone (primary hyperparathyroidism). °· A buildup of uric acid crystals, similar to gout in your joints. °· Narrowing (stricture) of the ureter. °· A kidney obstruction present at birth (congenital obstruction). °· Previous surgery on the kidney or ureters. °· Numerous kidney infections. °SYMPTOMS  °· Feeling sick to your stomach (nauseous). °· Throwing up (vomiting). °· Blood in the urine (hematuria). °· Pain that usually spreads (radiates) to the groin. °· Frequency or urgency of urination. °DIAGNOSIS  °· Taking a history and physical exam. °· Blood or urine tests. °· CT scan. °· Occasionally, an examination of the inside of the urinary bladder (cystoscopy) is performed. °TREATMENT  °· Observation. °· Increasing your fluid intake. °· Extracorporeal shock wave lithotripsy--This is a noninvasive procedure that uses shock waves to break up kidney stones. °· Surgery may be needed if you have severe pain or persistent obstruction. There are various surgical procedures. Most of the procedures are performed with the use of small instruments. Only small incisions are needed to accommodate these instruments, so recovery time is minimized. °The size, location, and chemical composition are all important variables that will determine the proper choice of action for you. Talk to your health care provider to better understand your situation so that you will minimize the risk of injury to yourself and your kidney.  °HOME CARE INSTRUCTIONS  °· Drink enough water and fluids to keep your urine clear or pale yellow. This will help you to pass the stone or stone fragments. °· Strain  all urine through the provided strainer. Keep all particulate matter and stones for your health care provider to see. The stone causing the pain may be as small as a grain of salt. It is very important to use the strainer each and every time you pass your urine. The collection of your stone will allow your health care provider to analyze it and verify that a stone has actually passed. The stone analysis will often identify what you can do to reduce the incidence of recurrences. °· Only take over-the-counter or prescription medicines for pain, discomfort, or fever as directed by your health care provider. °· Keep all follow-up visits as told by your health care provider. This is important. °· Get follow-up X-rays if required. The absence of pain does not always mean that the stone has passed. It may have only stopped moving. If the urine remains completely obstructed, it can cause loss of kidney function or even complete destruction of the kidney. It is your responsibility to make sure X-rays and follow-ups are completed. Ultrasounds of the kidney can show blockages and the status of the kidney. Ultrasounds are not associated with any radiation and can be performed easily in a matter of minutes. °· Make changes to your daily diet as told by your health care provider. You may be told to: °¨ Limit the amount of salt that you eat. °¨ Eat 5 or more servings of fruits and vegetables each day. °¨ Limit the amount of meat, poultry, fish, and eggs that you eat. °· Collect a 24-hour urine sample as told by your health care provider. You may need to collect another urine sample every 6-12   months. °SEEK MEDICAL CARE IF: °· You experience pain that is progressive and unresponsive to any pain medicine you have been prescribed. °SEEK IMMEDIATE MEDICAL CARE IF:  °· Pain cannot be controlled with the prescribed medicine. °· You have a fever or shaking chills. °· The severity or intensity of pain increases over 18 hours and is not  relieved by pain medicine. °· You develop a new onset of abdominal pain. °· You feel faint or pass out. °· You are unable to urinate. °  °This information is not intended to replace advice given to you by your health care provider. Make sure you discuss any questions you have with your health care provider. °  °Document Released: 10/25/2005 Document Revised: 07/16/2015 Document Reviewed: 03/28/2013 °Elsevier Interactive Patient Education ©2016 Elsevier Inc. ° °

## 2016-04-14 NOTE — Progress Notes (Signed)
   Subjective:    Patient ID: Caroline Hopkins, female    DOB: 04/05/1981, 35 y.o.   MRN: [id number]  Hematuria This is a new problem. The current episode started in the past 7 days. The problem is unchanged. She describes the hematuria as gross hematuria. Her pain is at a severity of 10/10. The pain is moderate. She describes her urine color as tea colored. Irritative symptoms do not include frequency or urgency. Obstructive symptoms include an intermittent stream, a slower stream and straining. Associated symptoms include chills, flank pain (right worse than left), nausea and vomiting. Pertinent negatives include no dysuria. Her past medical history is significant for kidney stones.      Review of Systems  Constitutional: Positive for chills.  Gastrointestinal: Positive for nausea and vomiting.  Genitourinary: Positive for hematuria and flank pain (right worse than left). Negative for dysuria, urgency and frequency.  All other systems reviewed and are negative.      Objective:   Physical Exam  Constitutional: She is oriented to person, place, and time. She appears well-developed and well-nourished. No distress.  HENT:  Head: Normocephalic and atraumatic.  Eyes: Pupils are equal, round, and reactive to light.  Neck: Normal range of motion. Neck supple. No thyromegaly present.  Cardiovascular: Normal rate, regular rhythm, normal heart sounds and intact distal pulses.   No murmur heard. Pulmonary/Chest: Effort normal and breath sounds normal. No respiratory distress. She has no wheezes.  Abdominal: Soft. Bowel sounds are normal. She exhibits no distension. There is no tenderness.  Musculoskeletal: Normal range of motion. She exhibits no edema or tenderness.  Right CVA tenderness   Neurological: She is alert and oriented to person, place, and time.  Skin: Skin is warm and dry.  Psychiatric: She has a normal mood and affect. Her behavior is normal. Judgment and thought content normal.    Vitals reviewed.     BP 123/88 mmHg  Pulse 66  Temp(Src) 97.3 F (36.3 C) (Oral)  Ht 5' 4.5" (1.638 m)  Wt 220 lb 6.4 oz (99.973 kg)  BMI 37.26 kg/m2  LMP 02/07/2016 (Approximate)     Assessment & Plan:  1. Hematuria - Urinalysis, Complete - CT RENAL STONE STUDY; Future - Urine culture - ketorolac (TORADOL) injection 60 mg; Inject 2 mLs (60 mg total) into the muscle once.  2. Right flank pain - CT RENAL STONE STUDY; Future - Urine culture - ketorolac (TORADOL) injection 60 mg; Inject 2 mLs (60 mg total) into the muscle once.  Urine culture pending Will do CT since pt states she has had "6 or 7 stones in the past". Pt states she has never seen an urologists and her last CT scan was in 2004.  Will give Toradol injection today, but hold off on pain medication at this time RTO prn   Jannifer Rodneyhristy Aarya Quebedeaux, FNP

## 2016-04-14 NOTE — Addendum Note (Signed)
Addended by: Almeta MonasSTONE, JANIE M on: 04/14/2016 03:30 PM   Modules accepted: Kipp BroodSmartSet

## 2016-04-15 LAB — URINE CULTURE

## 2016-04-19 ENCOUNTER — Ambulatory Visit (HOSPITAL_COMMUNITY)
Admission: RE | Admit: 2016-04-19 | Discharge: 2016-04-19 | Disposition: A | Discharge status: Home / Self Care | Payer: 59 | Source: Ambulatory Visit | Attending: Family | Admitting: Family

## 2016-04-19 DIAGNOSIS — R109 Unspecified abdominal pain: Secondary | ICD-10-CM | POA: Insufficient documentation

## 2016-04-19 DIAGNOSIS — N200 Calculus of kidney: Secondary | ICD-10-CM | POA: Insufficient documentation

## 2016-04-19 DIAGNOSIS — R319 Hematuria, unspecified: Secondary | ICD-10-CM | POA: Insufficient documentation

## 2016-04-19 LAB — POCT PREGNANCY, URINE: Preg Test, Ur: NEGATIVE

## 2016-04-20 ENCOUNTER — Other Ambulatory Visit: Payer: Self-pay | Admitting: Family

## 2016-04-20 DIAGNOSIS — N200 Calculus of kidney: Secondary | ICD-10-CM

## 2016-06-11 ENCOUNTER — Ambulatory Visit: Payer: 59 | Admitting: Urology

## 2016-06-28 ENCOUNTER — Encounter: Payer: Self-pay | Admitting: Family Medicine

## 2017-03-18 ENCOUNTER — Other Ambulatory Visit: Payer: Self-pay | Admitting: Family Medicine

## 2017-03-18 ENCOUNTER — Encounter: Payer: 59 | Admitting: Gynecology

## 2017-03-18 DIAGNOSIS — K219 Gastro-esophageal reflux disease without esophagitis: Secondary | ICD-10-CM

## 2017-03-23 ENCOUNTER — Encounter: Payer: Self-pay | Admitting: Gynecology

## 2017-04-01 ENCOUNTER — Encounter: Payer: Self-pay | Admitting: Gynecology

## 2017-04-01 ENCOUNTER — Ambulatory Visit (INDEPENDENT_AMBULATORY_CARE_PROVIDER_SITE_OTHER): Payer: 59 | Admitting: Gynecology

## 2017-04-01 VITALS — BP 132/80 | Ht 64.0 in | Wt 227.0 lb

## 2017-04-01 DIAGNOSIS — N920 Excessive and frequent menstruation with regular cycle: Secondary | ICD-10-CM

## 2017-04-01 DIAGNOSIS — E559 Vitamin D deficiency, unspecified: Secondary | ICD-10-CM

## 2017-04-01 DIAGNOSIS — F419 Anxiety disorder, unspecified: Secondary | ICD-10-CM

## 2017-04-01 DIAGNOSIS — F17200 Nicotine dependence, unspecified, uncomplicated: Secondary | ICD-10-CM

## 2017-04-01 DIAGNOSIS — N946 Dysmenorrhea, unspecified: Secondary | ICD-10-CM

## 2017-04-01 DIAGNOSIS — Z01419 Encounter for gynecological examination (general) (routine) without abnormal findings: Secondary | ICD-10-CM | POA: Diagnosis not present

## 2017-04-01 DIAGNOSIS — F329 Major depressive disorder, single episode, unspecified: Secondary | ICD-10-CM

## 2017-04-01 DIAGNOSIS — F32A Depression, unspecified: Secondary | ICD-10-CM

## 2017-04-01 MED ORDER — BUPROPION HCL 75 MG PO TABS
75.0000 mg | ORAL_TABLET | Freq: Two times a day (BID) | ORAL | 4 refills | Status: DC
Start: 2017-04-01 — End: 2018-05-19

## 2017-04-01 NOTE — Addendum Note (Signed)
Addended by: Kem ParkinsonBARNES, Mahamed Zalewski on: 04/01/2017 12:53 PM   Modules accepted: Orders

## 2017-04-01 NOTE — Patient Instructions (Addendum)
Levonorgestrel intrauterine device (IUD) What is this medicine? LEVONORGESTREL IUD (LEE voe nor jes trel) is a contraceptive (birth control) device. The device is placed inside the uterus by a healthcare professional. It is used to prevent pregnancy. This device can also be used to treat heavy bleeding that occurs during your period. This medicine may be used for other purposes; ask your health care provider or pharmacist if you have questions. COMMON BRAND NAME(S): Kyleena, LILETTA, Mirena, Skyla What should I tell my health care provider before I take this medicine? They need to know if you have any of these conditions: -abnormal Pap smear -cancer of the breast, uterus, or cervix -diabetes -endometritis -genital or pelvic infection now or in the past -have more than one sexual partner or your partner has more than one partner -heart disease -history of an ectopic or tubal pregnancy -immune system problems -IUD in place -liver disease or tumor -problems with blood clots or take blood-thinners -seizures -use intravenous drugs -uterus of unusual shape -vaginal bleeding that has not been explained -an unusual or allergic reaction to levonorgestrel, other hormones, silicone, or polyethylene, medicines, foods, dyes, or preservatives -pregnant or trying to get pregnant -breast-feeding How should I use this medicine? This device is placed inside the uterus by a health care professional. Talk to your pediatrician regarding the use of this medicine in children. Special care may be needed. Overdosage: If you think you have taken too much of this medicine contact a poison control center or emergency room at once. NOTE: This medicine is only for you. Do not share this medicine with others. What if I miss a dose? This does not apply. Depending on the brand of device you have inserted, the device will need to be replaced every 3 to 5 years if you wish to continue using this type of birth  control. What may interact with this medicine? Do not take this medicine with any of the following medications: -amprenavir -bosentan -fosamprenavir This medicine may also interact with the following medications: -aprepitant -armodafinil -barbiturate medicines for inducing sleep or treating seizures -bexarotene -boceprevir -griseofulvin -medicines to treat seizures like carbamazepine, ethotoin, felbamate, oxcarbazepine, phenytoin, topiramate -modafinil -pioglitazone -rifabutin -rifampin -rifapentine -some medicines to treat HIV infection like atazanavir, efavirenz, indinavir, lopinavir, nelfinavir, tipranavir, ritonavir -St. John's wort -warfarin This list may not describe all possible interactions. Give your health care provider a list of all the medicines, herbs, non-prescription drugs, or dietary supplements you use. Also tell them if you smoke, drink alcohol, or use illegal drugs. Some items may interact with your medicine. What should I watch for while using this medicine? Visit your doctor or health care professional for regular check ups. See your doctor if you or your partner has sexual contact with others, becomes HIV positive, or gets a sexual transmitted disease. This product does not protect you against HIV infection (AIDS) or other sexually transmitted diseases. You can check the placement of the IUD yourself by reaching up to the top of your vagina with clean fingers to feel the threads. Do not pull on the threads. It is a good habit to check placement after each menstrual period. Call your doctor right away if you feel more of the IUD than just the threads or if you cannot feel the threads at all. The IUD may come out by itself. You may become pregnant if the device comes out. If you notice that the IUD has come out use a backup birth control method like condoms and call your   health care provider. Using tampons will not change the position of the IUD and are okay to use  during your period. This IUD can be safely scanned with magnetic resonance imaging (MRI) only under specific conditions. Before you have an MRI, tell your healthcare provider that you have an IUD in place, and which type of IUD you have in place. What side effects may I notice from receiving this medicine? Side effects that you should report to your doctor or health care professional as soon as possible: -allergic reactions like skin rash, itching or hives, swelling of the face, lips, or tongue -fever, flu-like symptoms -genital sores -high blood pressure -no menstrual period for 6 weeks during use -pain, swelling, warmth in the leg -pelvic pain or tenderness -severe or sudden headache -signs of pregnancy -stomach cramping -sudden shortness of breath -trouble with balance, talking, or walking -unusual vaginal bleeding, discharge -yellowing of the eyes or skin Side effects that usually do not require medical attention (report to your doctor or health care professional if they continue or are bothersome): -acne -breast pain -change in sex drive or performance -changes in weight -cramping, dizziness, or faintness while the device is being inserted -headache -irregular menstrual bleeding within first 3 to 6 months of use -nausea This list may not describe all possible side effects. Call your doctor for medical advice about side effects. You may report side effects to FDA at 1-800-FDA-1088. Where should I keep my medicine? This does not apply. NOTE: This sheet is a summary. It may not cover all possible information. If you have questions about this medicine, talk to your doctor, pharmacist, or health care provider.  2018 Elsevier/Gold Standard (2016-08-06 14:14:56)  Steps to Quit Smoking Smoking tobacco can be harmful to your health and can affect almost every organ in your body. Smoking puts you, and those around you, at risk for developing many serious chronic diseases. Quitting  smoking is difficult, but it is one of the best things that you can do for your health. It is never too late to quit. What are the benefits of quitting smoking? When you quit smoking, you lower your risk of developing serious diseases and conditions, such as:  Lung cancer or lung disease, such as COPD.  Heart disease.  Stroke.  Heart attack.  Infertility.  Osteoporosis and bone fractures. Additionally, symptoms such as coughing, wheezing, and shortness of breath may get better when you quit. You may also find that you get sick less often because your body is stronger at fighting off colds and infections. If you are pregnant, quitting smoking can help to reduce your chances of having a baby of low birth weight. How do I get ready to quit? When you decide to quit smoking, create a plan to make sure that you are successful. Before you quit:  Pick a date to quit. Set a date within the next two weeks to give you time to prepare.  Write down the reasons why you are quitting. Keep this list in places where you will see it often, such as on your bathroom mirror or in your car or wallet.  Identify the people, places, things, and activities that make you want to smoke (triggers) and avoid them. Make sure to take these actions:  Throw away all cigarettes at home, at work, and in your car.  Throw away smoking accessories, such as Set designer.  Clean your car and make sure to empty the ashtray.  Clean your home, including curtains and  carpets.  Tell your family, friends, and coworkers that you are quitting. Support from your loved ones can make quitting easier.  Talk with your health care provider about your options for quitting smoking.  Find out what treatment options are covered by your health insurance. What strategies can I use to quit smoking? Talk with your healthcare provider about different strategies to quit smoking. Some strategies include:  Quitting smoking  altogether instead of gradually lessening how much you smoke over a period of time. Research shows that quitting "cold [place]key" is more successful than gradually quitting.  Attending in-person counseling to help you build problem-solving skills. You are more likely to have success in quitting if you attend several counseling sessions. Even short sessions of 10 minutes can be effective.  Finding resources and support systems that can help you to quit smoking and remain smoke-free after you quit. These resources are most helpful when you use them often. They can include:  Online chats with a Veterinary surgeoncounselor.  Telephone quitlines.  Printed Materials engineerself-help materials.  Support groups or group counseling.  Text messaging programs.  Mobile phone applications.  Taking medicines to help you quit smoking. (If you are pregnant or breastfeeding, talk with your health care provider first.) Some medicines contain nicotine and some do not. Both types of medicines help with cravings, but the medicines that include nicotine help to relieve withdrawal symptoms. Your health care provider may recommend:  Nicotine patches, gum, or lozenges.  Nicotine inhalers or sprays.  Non-nicotine medicine that is taken by mouth. Talk with your health care provider about combining strategies, such as taking medicines while you are also receiving in-person counseling. Using these two strategies together makes you more likely to succeed in quitting than if you used either strategy on its own. If you are pregnant or breastfeeding, talk with your health care provider about finding counseling or other support strategies to quit smoking. Do not take medicine to help you quit smoking unless told to do so by your health care provider. What things can I do to make it easier to quit? Quitting smoking might feel overwhelming at first, but there is a lot that you can do to make it easier. Take these important actions:  Reach out to your family  and friends and ask that they support and encourage you during this time. Call telephone quitlines, reach out to support groups, or work with a counselor for support.  Ask people who smoke to avoid smoking around you.  Avoid places that trigger you to smoke, such as bars, parties, or smoke-break areas at work.  Spend time around people who do not smoke.  Lessen stress in your life, because stress can be a smoking trigger for some people. To lessen stress, try:  Exercising regularly.  Deep-breathing exercises.  Yoga.  Meditating.  Performing a body scan. This involves closing your eyes, scanning your body from head to toe, and noticing which parts of your body are particularly tense. Purposefully relax the muscles in those areas.  Download or purchase mobile phone or tablet apps (applications) that can help you stick to your quit plan by providing reminders, tips, and encouragement. There are many free apps, such as QuitGuide from the [employer] developer(Centers for Disease Control and Prevention). You can find other support for quitting smoking (smoking cessation) through smokefree.gov and other websites. How will I feel when I quit smoking? Within the first 24 hours of quitting smoking, you may start to feel some withdrawal symptoms. These symptoms are  usually most noticeable 2-3 days after quitting, but they usually do not last beyond 2-3 weeks. Changes or symptoms that you might experience include:  Mood swings.  Restlessness, anxiety, or irritation.  Difficulty concentrating.  Dizziness.  Strong cravings for sugary foods in addition to nicotine.  Mild weight gain.  Constipation.  Nausea.  Coughing or a sore throat.  Changes in how your medicines work in your body.  A depressed mood.  Difficulty sleeping (insomnia). After the first 2-3 weeks of quitting, you may start to notice more positive results, such as:  Improved sense of smell and taste.  Decreased coughing and sore  throat.  Slower heart rate.  Lower blood pressure.  Clearer skin.  The ability to breathe more easily.  Fewer sick days. Quitting smoking is very challenging for most people. Do not get discouraged if you are not successful the first time. Some people need to make many attempts to quit before they achieve long-term success. Do your best to stick to your quit plan, and talk with your health care provider if you have any questions or concerns. This information is not intended to replace advice given to you by your health care provider. Make sure you discuss any questions you have with your health care provider. Document Released: 10/19/2001 Document Revised: 06/22/2016 Document Reviewed: 03/11/2015 Elsevier Interactive Patient Education  2017 [employer].

## 2017-04-01 NOTE — Progress Notes (Signed)
Caroline Hopkins 11/15/1980 [id number]   History:    36 y.o.  for annual gyn exam who was seen last year for the first time as a new patient. Patient stated several years ago she had had a LEEP cervical conization for dysplasia and subsequent Pap smears of been normal. She's had 2 cesarean sections and the last one resulting in a tubal ligation. She states that in the month of March and April she had 2 periods during the same month but now her cycles are monthly with her lasting 7-10 days and very heavy with a lot of cramping and since her C-section she's always had right lower quadrant discomfort near her C-section scar. She has a chronic smoker and had been placed on Wellbutrin incised for depression and she's working on discontinuing this bad habit. Patient has had history vitamin D deficiency last year was treated but did not return to the office to check her vitamin D level and is currently not taking vitamin D. Patient stated that several years ago in 2014 by another provider because of a tenderness she had an the breasts she had a mammogram which was reportedly normal.  Past medical history,surgical history, family history and social history were all reviewed and documented in the EPIC chart.  Gynecologic History Patient's last menstrual period was 03/16/2017 (approximate). Contraception: tubal ligation Last Pap: 2017. Results were: normal Last mammogram: See above. Results were: See above  Obstetric History OB History  Gravida Para Term Preterm AB Living  2 2       1   SAB TAB Ectopic Multiple Live Births               # Outcome Date GA Lbr Len/2nd Weight Sex Delivery Anes PTL Lv  2 Para           1 Para                ROS: A ROS was performed and pertinent positives and negatives are included in the history.  GENERAL: No fevers or chills. HEENT: No change in vision, no earache, sore throat or sinus congestion. NECK: No pain or stiffness. CARDIOVASCULAR: No chest pain or  pressure. No palpitations. PULMONARY: No shortness of breath, cough or wheeze. GASTROINTESTINAL: No abdominal pain, nausea, vomiting or diarrhea, melena or bright red blood per rectum. GENITOURINARY: No urinary frequency, urgency, hesitancy or dysuria. MUSCULOSKELETAL: No joint or muscle pain, no back pain, no recent trauma. DERMATOLOGIC: No rash, no itching, no lesions. ENDOCRINE: No polyuria, polydipsia, no heat or cold intolerance. No recent change in weight. HEMATOLOGICAL: No anemia or easy bruising or bleeding. NEUROLOGIC: No headache, seizures, numbness, tingling or weakness. PSYCHIATRIC: No depression, no loss of interest in normal activity or change in sleep pattern.     Exam: chaperone present  BP 132/80   Ht 5\' 4"  (1.626 m)   Wt 227 lb (103 kg)   LMP 03/16/2017 (Approximate)   BMI 38.96 kg/m   Body mass index is 38.96 kg/m.  General appearance : Well developed well nourished female. No acute distress HEENT: Eyes: no retinal hemorrhage or exudates,  Neck supple, trachea midline, no carotid bruits, no thyroidmegaly Lungs: Clear to auscultation, no rhonchi or wheezes, or rib retractions  Heart: Regular rate and rhythm, no murmurs or gallops Breast:Examined in sitting and supine position were symmetrical in appearance, no palpable masses or tenderness,  no skin retraction, no nipple inversion, no nipple discharge, no skin discoloration, no axillary or supraclavicular lymphadenopathy Abdomen:  no palpable masses or tenderness, no rebound or guarding Extremities: no edema or skin discoloration or tenderness  Pelvic:  Bartholin, Urethra, Skene Glands: Within normal limits             Vagina: No gross lesions or discharge  Cervix: No gross lesions or discharge  Uterus  anteverted, normal size, shape and consistency, non-tender and mobile  Adnexa tenderness right lower abdomen no rebound or guarding  Anus and perineum  normal   Rectovaginal  normal sphincter tone without palpated  masses or tenderness             Hemoccult not indicated     Assessment/Plan:  36 y.o. female for annual exam with dysmenorrhea and menorrhagia and right lower quadrant pain will be scheduled for sonohysterogram in the next few weeks along with her fasting blood work consisting of fasting lipid profile, comprehensive metabolic panel, TSH, CBC, and urinalysis. Because of history of LEEP cervical conization we are doing a Pap smear every year and Pap smear was done today. Because her history vitamin D deficiency we will be checking her vitamin D level today as well. Literature information on smoking was provided. Refill for her Wellbutrin was provided. Literature information on the Mirena IUD was also provided.   Ok Edwards MD, 11:53 AM 04/01/2017

## 2017-04-02 LAB — URINALYSIS W MICROSCOPIC + REFLEX CULTURE
Bacteria, UA: NONE SEEN [HPF]
Bilirubin Urine: NEGATIVE
Casts: NONE SEEN [LPF]
Crystals: NONE SEEN [HPF]
Glucose, UA: NEGATIVE
Hgb urine dipstick: NEGATIVE
Ketones, ur: NEGATIVE
Leukocytes, UA: NEGATIVE
Nitrite: NEGATIVE
Protein, ur: NEGATIVE
RBC / HPF: NONE SEEN RBC/HPF (ref <=2)
Specific Gravity, Urine: 1.008 (ref 1.001–1.035)
Squamous Epithelial / LPF: NONE SEEN [HPF] (ref <=5)
WBC, UA: NONE SEEN WBC/HPF (ref <=5)
Yeast: NONE SEEN [HPF]
pH: 7.5 (ref 5.0–8.0)

## 2017-04-05 ENCOUNTER — Other Ambulatory Visit: Payer: Self-pay | Admitting: *Deleted

## 2017-04-05 DIAGNOSIS — N946 Dysmenorrhea, unspecified: Secondary | ICD-10-CM

## 2017-04-06 LAB — PAP IG W/ RFLX HPV ASCU

## 2017-04-27 ENCOUNTER — Other Ambulatory Visit: Payer: 59

## 2017-04-27 ENCOUNTER — Ambulatory Visit: Payer: 59 | Admitting: Gynecology

## 2017-04-27 ENCOUNTER — Encounter: Payer: Self-pay | Admitting: Gynecology

## 2017-04-27 ENCOUNTER — Ambulatory Visit (INDEPENDENT_AMBULATORY_CARE_PROVIDER_SITE_OTHER): Payer: 59 | Admitting: Gynecology

## 2017-04-27 VITALS — BP 124/80

## 2017-04-27 DIAGNOSIS — Z975 Presence of (intrauterine) contraceptive device: Secondary | ICD-10-CM | POA: Insufficient documentation

## 2017-04-27 DIAGNOSIS — Z30430 Encounter for insertion of intrauterine contraceptive device: Secondary | ICD-10-CM | POA: Diagnosis not present

## 2017-04-27 LAB — CBC WITH DIFFERENTIAL/PLATELET
Basophils Absolute: 91 cells/uL (ref 0–200)
Basophils Relative: 1 %
Eosinophils Absolute: 455 cells/uL (ref 15–500)
Eosinophils Relative: 5 %
HCT: 40.3 % (ref 35.0–45.0)
Hemoglobin: 13.5 g/dL (ref 11.7–15.5)
Lymphocytes Relative: 27 %
Lymphs Abs: 2457 cells/uL (ref 850–3900)
MCH: 29.5 pg (ref 27.0–33.0)
MCHC: 33.5 g/dL (ref 32.0–36.0)
MCV: 88 fL (ref 80.0–100.0)
MPV: 9.3 fL (ref 7.5–12.5)
Monocytes Absolute: 455 cells/uL (ref 200–950)
Monocytes Relative: 5 %
Neutro Abs: 5642 cells/uL (ref 1500–7800)
Neutrophils Relative %: 62 %
Platelets: 258 10*3/uL (ref 140–400)
RBC: 4.58 MIL/uL (ref 3.80–5.10)
RDW: 14.9 % (ref 11.0–15.0)
WBC: 9.1 10*3/uL (ref 3.8–10.8)

## 2017-04-27 LAB — LIPID PANEL
Cholesterol: 146 mg/dL (ref <200)
HDL: 42 mg/dL — ABNORMAL LOW (ref >50)
LDL Cholesterol: 82 mg/dL (ref <100)
Total CHOL/HDL Ratio: 3.5 Ratio (ref <5.0)
Triglycerides: 109 mg/dL (ref <150)
VLDL: 22 mg/dL (ref <30)

## 2017-04-27 LAB — TSH: TSH: 0.95 mIU/L

## 2017-04-27 LAB — COMPREHENSIVE METABOLIC PANEL
ALT: 12 U/L (ref 6–29)
AST: 12 U/L (ref 10–30)
Albumin: 4.2 g/dL (ref 3.6–5.1)
Alkaline Phosphatase: 44 U/L (ref 33–115)
BUN: 11 mg/dL (ref 7–25)
CO2: 25 mmol/L (ref 20–31)
Calcium: 9.1 mg/dL (ref 8.6–10.2)
Chloride: 107 mmol/L (ref 98–110)
Creat: 0.6 mg/dL (ref 0.50–1.10)
Glucose, Bld: 83 mg/dL (ref 65–99)
Potassium: 4.2 mmol/L (ref 3.5–5.3)
Sodium: 140 mmol/L (ref 135–146)
Total Bilirubin: 0.7 mg/dL (ref 0.2–1.2)
Total Protein: 7.2 g/dL (ref 6.1–8.1)

## 2017-04-27 NOTE — Progress Notes (Signed)
   Patient is a 36 year old t41hat was seen the office May 25 of this year for her annual exam and had voiced her cycles were heavy. She had occasional vague lower, we'll discomfort. Patient with past history of tubal ligation. She is here fasting for her blood work that was not done on last office visit here. She is here for placement of Mirena IUD to help with her heavy cycles. Her last Mr. Marina Goodellerry was one week ago.                                                                    IUD procedure note       Patient presented to the office today for placement of Mirena IUD. The patient had previously been provided with literature information on this method of contraception. The risks benefits and pros and cons were discussed and all her questions were answered. She is fully aware that this form of contraception is 99% effective and is good for 5 years.  Pelvic exam: Bartholin urethra Skene glands: Within normal limits Vagina: No lesions or discharge Cervix: No lesions or discharge Uterus: Anteverted position Adnexa: No masses or tenderness Rectal exam: Not done  The cervix was cleansed with Betadine solution. A single-tooth tenaculum was placed on the anterior cervical lip. The uterus sounded to 7-1/2 centimeter. The IUD was shown to the patient and inserted in a sterile fashion. The IUD string was trimmed. The single-tooth tenaculum was removed. Patient was instructed to return back to the office in one month for follow up.

## 2017-04-27 NOTE — Patient Instructions (Signed)

## 2017-04-28 ENCOUNTER — Ambulatory Visit: Payer: 59 | Admitting: Gynecology

## 2017-04-28 ENCOUNTER — Other Ambulatory Visit: Payer: Self-pay | Admitting: Gynecology

## 2017-04-28 ENCOUNTER — Encounter: Payer: Self-pay | Admitting: Anesthesiology

## 2017-04-28 ENCOUNTER — Encounter: Payer: Self-pay | Admitting: Gynecology

## 2017-04-28 DIAGNOSIS — E559 Vitamin D deficiency, unspecified: Secondary | ICD-10-CM

## 2017-04-28 LAB — VITAMIN D 25 HYDROXY (VIT D DEFICIENCY, FRACTURES): Vit D, 25-Hydroxy: 29 ng/mL — ABNORMAL LOW (ref 30–100)

## 2017-04-28 MED ORDER — VITAMIN D (ERGOCALCIFEROL) 1.25 MG (50000 UNIT) PO CAPS: 50000.0000 [IU] | ORAL_CAPSULE | ORAL | 0 refills | Status: DC

## 2017-05-26 ENCOUNTER — Ambulatory Visit: Payer: 59 | Admitting: Gynecology

## 2017-05-30 ENCOUNTER — Ambulatory Visit (INDEPENDENT_AMBULATORY_CARE_PROVIDER_SITE_OTHER): Payer: 59 | Admitting: Physician Assistant

## 2017-05-30 ENCOUNTER — Encounter: Payer: Self-pay | Admitting: Physician Assistant

## 2017-05-30 VITALS — BP 112/82 | HR 71 | Temp 97.7°F | Ht 64.0 in | Wt 232.6 lb

## 2017-05-30 DIAGNOSIS — M79672 Pain in left foot: Secondary | ICD-10-CM | POA: Diagnosis not present

## 2017-05-30 DIAGNOSIS — B070 Plantar wart: Secondary | ICD-10-CM | POA: Diagnosis not present

## 2017-05-30 MED ORDER — ACETAMINOPHEN-CODEINE #3 300-30 MG PO TABS: 1.0000 | ORAL_TABLET | Freq: Four times a day (QID) | ORAL | 0 refills | Status: DC | PRN

## 2017-05-30 NOTE — Patient Instructions (Signed)
Plantar Warts Plantar warts are small growths on the bottom of the foot (sole). Warts are caused by a type of germ (virus). Most warts are not painful, and they usually do not cause problems. Sometimes, plantar warts can cause pain when you walk. Warts often go away on their own in time. Treatments may be done if needed. Follow these instructions at home: General instructions  Apply creams or solutions only as told by your doctor. Follow these steps if your doctor tells you to do so: ? Soak your foot in warm water. ? Remove the top layer of softened skin before you apply the medicine. You can use a pumice stone to remove the tissue. ? After you apply the medicine, put a bandage over the area of the wart. ? Repeat the process every day or as told by your doctor.  Do not scratch or pick at a wart.  Wash your hands after you touch a wart.  If a wart is painful, try putting a bandage with a hole in the middle over the wart.  Keep all follow-up visits as told by your doctor. This is important. Prevention  Wear shoes and socks. Change socks every day.  Keep your feet clean and dry.  Check your feet often.  Avoid direct contact with warts on other people. Contact a doctor if:  Your warts do not improve after treatment.  You have redness, swelling, or pain at the site of a wart.  You have bleeding from a wart, and the bleeding does not stop when you put light pressure on the wart.  You have diabetes and you get a wart. This information is not intended to replace advice given to you by your health care provider. Make sure you discuss any questions you have with your health care provider. Document Released: 11/27/2010 Document Revised: 04/01/2016 Document Reviewed: 01/20/2015 Elsevier Interactive Patient Education  2018 [employer]  

## 2017-05-30 NOTE — Progress Notes (Signed)
BP 112/82   Pulse 71   Temp 97.7 F (36.5 C) (Oral)   Ht 5\' 4"  (1.626 m)   Wt 232 lb 9.6 oz (105.5 kg)   BMI 39.93 kg/m    Subjective:    Patient ID: Caroline Hopkins, female    DOB: 09/17/1981, 36 y.o.   MRN: [id number]  HPI: Caroline Hopkins is a 36 y.o. female presenting on 05/30/2017 for Plantar Warts (Bottom of left foot)  Patient has had a plantar wart on her left foot for several months now. She's had cryo-treatment one time. It was successful for just a couple weeks in shrinking the wart but never making it completely go away. It came back with more vengeance. In the past week she has had a great amount of pain working. 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 She is in need of removing this urgently. We are going to try cryo-today's see if there can be some reduction in it and make a dermatology referral for soon as possible.  Relevant past medical, surgical, family and social history reviewed and updated as indicated. Allergies and medications reviewed and updated.  Past Medical History:  Diagnosis Date  . Allergy    seasonal  . Anemia   . Anxiety   . Depression   . GERD (gastroesophageal reflux disease)   . Pneumonia    every year  . Vitamin D deficiency     Past Surgical History:  Procedure Laterality Date  . CESAREAN SECTION  1999 and 2001  . FRACTURE SURGERY     left ankle repair x3  . SUBOCCIPITAL CRANIECTOMY CERVICAL LAMINECTOMY  09/11/2012   Procedure: SUBOCCIPITAL CRANIECTOMY CERVICAL LAMINECTOMY/DURAPLASTY;  Surgeon: Clydene FakeJames R Hirsch, MD;  Location: [facility];  Service: Neurosurgery;  Laterality: N/A;  Chiari decompression/Suboccipital craniectomy with Cervical one laminectomy/dural patch graft  . TUBAL LIGATION  2001    Review of Systems  Constitutional: Negative.   HENT: Negative.   Eyes: Negative.   Respiratory: Negative.   Gastrointestinal: Negative.   Genitourinary: Negative.   Skin: Positive for color change and wound.    Allergies as of  05/30/2017      Reactions   Rocephin [ceftriaxone] Anaphylaxis   Latex Other (See Comments)   Blisters    Oxycodone Nausea And Vomiting      Medication List       Accurate as of 05/30/17  9:36 AM. Always use your most recent med list.          acetaminophen-codeine 300-30 MG tablet Commonly known as:  TYLENOL #3 Take 1-2 tablets by mouth every 6 (six) hours as needed for moderate pain.   buPROPion 75 MG tablet Commonly known as:  WELLBUTRIN Take 1 tablet (75 mg total) by mouth 2 (two) times daily.   diphenhydrAMINE 25 mg capsule Commonly known as:  BENADRYL Take 25 mg by mouth as needed.   ibuprofen 800 MG tablet Commonly known as:  ADVIL,MOTRIN Take 800 mg by mouth every 8 (eight) hours as needed.   omeprazole 20 MG capsule Commonly known as:  PRILOSEC TAKE ONE (1) CAPSULE EACH DAY   Vitamin D (Ergocalciferol) 50000 units Caps capsule Commonly known as:  DRISDOL Take 1 capsule (50,000 Units total) by mouth every 7 (seven) days.          Objective:    BP 112/82   Pulse 71   Temp 97.7 F (36.5 C) (Oral)   Ht 5\' 4"  (1.626 m)  Wt 232 lb 9.6 oz (105.5 kg)   BMI 39.93 kg/m   Allergies  Allergen Reactions  . Rocephin [Ceftriaxone] Anaphylaxis  . Latex Other (See Comments)    Blisters   . Oxycodone Nausea And Vomiting    Physical Exam  Constitutional: She is oriented to person, place, and time. She appears well-developed and well-nourished.  HENT:  Head: Normocephalic and atraumatic.  Eyes: Pupils are equal, round, and reactive to light. Conjunctivae and EOM are normal.  Cardiovascular: Normal rate, regular rhythm, normal heart sounds and intact distal pulses.   Pulmonary/Chest: Effort normal and breath sounds normal.  Abdominal: Soft. Bowel sounds are normal.  Musculoskeletal:       Feet:  Plantar wart on left plantar surface at lateral edge. 2 cm of redness around the wart showing irritation  Neurological: She is alert and oriented to person, place,  and time. She has normal reflexes.  Skin: Skin is warm and dry. No rash noted.  Psychiatric: She has a normal mood and affect. Her behavior is normal. Judgment and thought content normal.        Assessment & Plan:   1. Plantar wart, left foot - Ambulatory referral to Dermatology Cryo treatment four rounds to left lateral foot of large inflamed wart.  2. Foot pain, left - Ambulatory referral to Dermatology   Continue all other maintenance medications as listed above.  Follow up plan: Return if symptoms worsen or fail to improve.  Educational handout given for plantar wart  Remus Loffler PA-C [facility] [address]  [place], [place] [address] [phone]   05/30/2017, 9:36 AM

## 2017-06-07 DIAGNOSIS — B078 Other viral warts: Secondary | ICD-10-CM | POA: Diagnosis not present

## 2018-01-19 ENCOUNTER — Encounter: Payer: Self-pay | Admitting: Family Medicine

## 2018-01-19 ENCOUNTER — Ambulatory Visit: Payer: 59 | Admitting: Family Medicine

## 2018-01-19 VITALS — BP 122/87 | HR 82 | Temp 98.9°F | Ht 64.0 in | Wt 239.0 lb

## 2018-01-19 DIAGNOSIS — R05 Cough: Secondary | ICD-10-CM | POA: Diagnosis not present

## 2018-01-19 DIAGNOSIS — J0100 Acute maxillary sinusitis, unspecified: Secondary | ICD-10-CM | POA: Diagnosis not present

## 2018-01-19 DIAGNOSIS — R059 Cough, unspecified: Secondary | ICD-10-CM

## 2018-01-19 LAB — VERITOR FLU A/B WAIVED
Influenza A: NEGATIVE
Influenza B: NEGATIVE

## 2018-01-19 MED ORDER — LEVOFLOXACIN 750 MG PO TABS: 750.0000 mg | ORAL_TABLET | Freq: Every day | ORAL | 0 refills | Status: DC

## 2018-01-19 NOTE — Patient Instructions (Signed)
Great to see you!   Sinusitis, Adult Sinusitis is soreness and inflammation of your sinuses. Sinuses are hollow spaces in the bones around your face. They are located:  Around your eyes.  In the middle of your forehead.  Behind your nose.  In your cheekbones.  Your sinuses and nasal passages are lined with a stringy fluid (mucus). Mucus normally drains out of your sinuses. When your nasal tissues get inflamed or swollen, the mucus can get trapped or blocked so air cannot flow through your sinuses. This lets bacteria, viruses, and funguses grow, and that leads to infection. Follow these instructions at home: Medicines  Take, use, or apply over-the-counter and prescription medicines only as told by your doctor. These may include nasal sprays.  If you were prescribed an antibiotic medicine, take it as told by your doctor. Do not stop taking the antibiotic even if you start to feel better. Hydrate and Humidify  Drink enough water to keep your pee (urine) clear or pale yellow.  Use a cool mist humidifier to keep the humidity level in your home above 50%.  Breathe in steam for 10-15 minutes, 3-4 times a day or as told by your doctor. You can do this in the bathroom while a hot shower is running.  Try not to spend time in cool or dry air. Rest  Rest as much as possible.  Sleep with your head raised (elevated).  Make sure to get enough sleep each night. General instructions  Put a warm, moist washcloth on your face 3-4 times a day or as told by your doctor. This will help with discomfort.  Wash your hands often with soap and water. If there is no soap and water, use hand sanitizer.  Do not smoke. Avoid being around people who are smoking (secondhand smoke).  Keep all follow-up visits as told by your doctor. This is important. Contact a doctor if:  You have a fever.  Your symptoms get worse.  Your symptoms do not get better within 10 days. Get help right away if:  You  have a very bad headache.  You cannot stop throwing up (vomiting).  You have pain or swelling around your face or eyes.  You have trouble seeing.  You feel confused.  Your neck is stiff.  You have trouble breathing. This information is not intended to replace advice given to you by your health care provider. Make sure you discuss any questions you have with your health care provider. Document Released: 04/12/2008 Document Revised: 06/20/2016 Document Reviewed: 08/20/2015 Elsevier Interactive Patient Education  2018 Elsevier Inc.  

## 2018-01-19 NOTE — Progress Notes (Signed)
   HPI  Patient presents today here for acute illness.  Patient explains she has had 3-4 days of headache, congestion, cough, sneezing, now bilateral maxillary sinus pain.  She is tolerating food and fluids like usual.  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  She denies any chest pain, fever, chills, sweats, or body aches.  PMH: Smoking status noted ROS: Per HPI  Objective: BP 122/87   Pulse 82   Temp 98.9 F (37.2 C) (Oral)   Ht 5\' 4"  (1.626 m)   Wt 239 lb (108.4 kg)   SpO2 97%   BMI 41.02 kg/m  Gen: NAD, alert, cooperative with exam HEENT: NCAT, right-sided maxillary tenderness to palpation, TMs normal bilaterally, oropharynx moist and clear CV: RRR, good S1/S2, no murmur Resp: CTABL, no wheezes, non-labored Ext: No edema, warm Neuro: Alert and oriented, No gross deficits  Assessment and plan:  #Acute maxillary sinusitis Treat with Levaquin, has history of Rocephin allergy Discussed supportive care Return to clinic with any concerns   Orders Placed This Encounter  Procedures  . Veritor Flu A/B Waived    Order Specific Question:   Source    Answer:   nasal    Meds ordered this encounter  Medications  . levofloxacin (LEVAQUIN) 750 MG tablet    Sig: Take 1 tablet (750 mg total) by mouth daily.    Dispense:  10 tablet    Refill:  0    Murtis SinkSam Jayla Mackie, MD [facility] 01/19/2018, 2:04 PM

## 2018-03-31 ENCOUNTER — Ambulatory Visit: Payer: 59 | Admitting: Family Medicine

## 2018-03-31 ENCOUNTER — Encounter: Payer: Self-pay | Admitting: Family Medicine

## 2018-03-31 VITALS — BP 134/82 | HR 70 | Temp 98.3°F | Ht 64.0 in | Wt 231.0 lb

## 2018-03-31 DIAGNOSIS — Z8639 Personal history of other endocrine, nutritional and metabolic disease: Secondary | ICD-10-CM

## 2018-03-31 DIAGNOSIS — N926 Irregular menstruation, unspecified: Secondary | ICD-10-CM

## 2018-03-31 DIAGNOSIS — Z6839 Body mass index (BMI) 39.0-39.9, adult: Secondary | ICD-10-CM

## 2018-03-31 DIAGNOSIS — F5104 Psychophysiologic insomnia: Secondary | ICD-10-CM

## 2018-03-31 DIAGNOSIS — Z13220 Encounter for screening for lipoid disorders: Secondary | ICD-10-CM | POA: Diagnosis not present

## 2018-03-31 DIAGNOSIS — G4700 Insomnia, unspecified: Secondary | ICD-10-CM | POA: Insufficient documentation

## 2018-03-31 DIAGNOSIS — F339 Major depressive disorder, recurrent, unspecified: Secondary | ICD-10-CM

## 2018-03-31 DIAGNOSIS — Z131 Encounter for screening for diabetes mellitus: Secondary | ICD-10-CM

## 2018-03-31 LAB — PREGNANCY, URINE: Preg Test, Ur: NEGATIVE

## 2018-03-31 MED ORDER — DESVENLAFAXINE SUCCINATE ER 25 MG PO TB24: 25.0000 mg | ORAL_TABLET | Freq: Every day | ORAL | 1 refills | Status: DC

## 2018-03-31 NOTE — Assessment & Plan Note (Signed)
Patient has fatty liver disease

## 2018-03-31 NOTE — Progress Notes (Signed)
BP 134/82   Pulse 70   Temp 98.3 F (36.8 C) (Oral)   Ht '5\' 4"'  (1.626 m)   Wt 231 lb (104.8 kg)   BMI 39.65 kg/m    Subjective:    Patient ID: Caroline Hopkins, female    DOB: 01-10-1981, 37 y.o.   MRN: [id number]  HPI: Caroline Hopkins is a 37 y.o. female presenting on 03/31/2018 for Labwork for work; Discuss medication (has noticed when she takes Wellbutrin in the morning it makes her head "feel funny", difficulty concentrating; when she takes it at 12 or 1 pm she becomes very anxious; now seems to be a very hard time for her - her son passed away 5 years ago and she has been very upset, this would have been the year he graduated high school); and Insomnia   HPI Depression and anxiety Patient has been having a lot of depression and anxiety more frequently recently because of her son's memory and this would be the year that he graduated high school and she is having difficulty with that.  She also feels like the Wellbutrin is not working like it was and it has been affecting her mental ability to function which caused her to possibly have an air at work.  She says she has not felt as depressed but just feels more anxious she denies any suicidal ideations or thoughts of hurting herself.  She has been having difficulty sleeping because of this as well. Depression screen [facility] 2/9 03/31/2018 01/19/2018 05/30/2017 04/14/2016 01/02/2016  Decreased Interest 0 0 0 0 0  Down, Depressed, Hopeless 2 1 0 0 0  PHQ - 2 Score 2 1 0 0 0  Altered sleeping 3 - - - -  Tired, decreased energy 3 - - - -  Change in appetite 1 - - - -  Feeling bad or failure about yourself  0 - - - -  Trouble concentrating 1 - - - -  Moving slowly or fidgety/restless 0 - - - -  Suicidal thoughts 0 - - - -  PHQ-9 Score 10 - - - -  Difficult doing work/chores - - - - -    Missed menstrual cycle, patient says she has not had a menstrual cycle in 3 months, she is sexually active and is not using protection or condoms  currently.  Relevant past medical, surgical, family and social history reviewed and updated as indicated. Interim medical history since our last visit reviewed. Allergies and medications reviewed and updated.  Review of Systems  Constitutional: Positive for fatigue. Negative for chills and fever.  HENT: Negative for congestion, ear discharge and ear pain.   Eyes: Negative for redness and visual disturbance.  Respiratory: Negative for chest tightness and shortness of breath.   Cardiovascular: Negative for chest pain and leg swelling.  Gastrointestinal: Negative for abdominal pain.  Genitourinary: Positive for menstrual problem. Negative for difficulty urinating, dysuria and pelvic pain.  Musculoskeletal: Negative for back pain and gait problem.  Skin: Negative for rash.  Neurological: Negative for dizziness, weakness, light-headedness, numbness and headaches.  Psychiatric/Behavioral: Positive for dysphoric mood and sleep disturbance. Negative for agitation, behavioral problems, self-injury and suicidal ideas. The patient is nervous/anxious.   All other systems reviewed and are negative.   Per HPI unless specifically indicated above   Allergies as of 03/31/2018      Reactions   Rocephin [ceftriaxone] Anaphylaxis   Latex Other (See Comments)   Blisters    Oxycodone Nausea And Vomiting  Medication List        Accurate as of 03/31/18  9:23 AM. Always use your most recent med list.          buPROPion 75 MG tablet Commonly known as:  WELLBUTRIN Take 1 tablet (75 mg total) by mouth 2 (two) times daily.   Desvenlafaxine Succinate ER 25 MG Tb24 Commonly known as:  PRISTIQ Take 25 mg by mouth daily.   ibuprofen 800 MG tablet Commonly known as:  ADVIL,MOTRIN Take 800 mg by mouth every 8 (eight) hours as needed.   omeprazole 20 MG capsule Commonly known as:  PRILOSEC TAKE ONE (1) CAPSULE EACH DAY   Vitamin D (Ergocalciferol) 50000 units Caps capsule Commonly known as:   DRISDOL Take 1 capsule (50,000 Units total) by mouth every 7 (seven) days.          Objective:    BP 134/82   Pulse 70   Temp 98.3 F (36.8 C) (Oral)   Ht '5\' 4"'  (1.626 m)   Wt 231 lb (104.8 kg)   BMI 39.65 kg/m   Wt Readings from Last 3 Encounters:  03/31/18 231 lb (104.8 kg)  01/19/18 239 lb (108.4 kg)  05/30/17 232 lb 9.6 oz (105.5 kg)    Physical Exam  Constitutional: She is oriented to person, place, and time. She appears well-developed and well-nourished. No distress.  Eyes: Conjunctivae are normal.  Cardiovascular: Normal rate, regular rhythm, normal heart sounds and intact distal pulses.  No murmur heard. Pulmonary/Chest: Effort normal and breath sounds normal. No respiratory distress. She has no wheezes.  Musculoskeletal: Normal range of motion. She exhibits no edema or tenderness.  Neurological: She is alert and oriented to person, place, and time. Coordination normal.  Skin: Skin is warm and dry. No rash noted. She is not diaphoretic.  Psychiatric: Her behavior is normal. Her mood appears anxious. She exhibits a depressed mood. She expresses no suicidal ideation. She expresses no suicidal plans.  Nursing note and vitals reviewed.       Assessment & Plan:   Problem List Items Addressed This Visit      Other   OBESITY, UNSPECIFIED    Patient has fatty liver disease      Relevant Orders   CMP14+EGFR   Lipid panel   Depression, recurrent ([place]) - Primary   Relevant Medications   Desvenlafaxine Succinate ER (PRISTIQ) 25 MG TB24   Other Relevant Orders   CBC with Differential/Platelet   CMP14+EGFR   Lipid panel   TSH   Insomnia   Relevant Medications   Desvenlafaxine Succinate ER (PRISTIQ) 25 MG TB24   Other Relevant Orders   TSH    Other Visit Diagnoses    Missed menses       Relevant Orders   TSH   Lipid screening       Relevant Orders   Lipid panel   Diabetes mellitus screening       Relevant Orders   CMP14+EGFR       Follow up  plan: Return in about 1 month (around 04/28/2018), or if symptoms worsen or fail to improve, for Recheck depression.  Counseling provided for all of the vaccine components Orders Placed This Encounter  Procedures  . CBC with Differential/Platelet  . CMP14+EGFR  . Lipid panel  . TSH    Caryl Pina, MD [facility] 03/31/2018, 9:23 AM

## 2018-03-31 NOTE — Addendum Note (Signed)
Addended by: Arville Care on: 03/31/2018 09:27 AM   Modules accepted: Orders

## 2018-03-31 NOTE — Addendum Note (Signed)
Addended by: Arville Care on: 03/31/2018 10:53 AM   Modules accepted: Orders

## 2018-04-01 LAB — CBC WITH DIFFERENTIAL/PLATELET
Basophils Absolute: 0.1 10*3/uL (ref 0.0–0.2)
Basos: 1 %
EOS (ABSOLUTE): 0.3 10*3/uL (ref 0.0–0.4)
Eos: 3 %
Hematocrit: 42.5 % (ref 34.0–46.6)
Hemoglobin: 14.2 g/dL (ref 11.1–15.9)
Immature Grans (Abs): 0 10*3/uL (ref 0.0–0.1)
Immature Granulocytes: 0 %
Lymphocytes Absolute: 2 10*3/uL (ref 0.7–3.1)
Lymphs: 23 %
MCH: 29.6 pg (ref 26.6–33.0)
MCHC: 33.4 g/dL (ref 31.5–35.7)
MCV: 89 fL (ref 79–97)
Monocytes Absolute: 0.5 10*3/uL (ref 0.1–0.9)
Monocytes: 5 %
Neutrophils Absolute: 5.7 10*3/uL (ref 1.4–7.0)
Neutrophils: 68 %
Platelets: 260 10*3/uL (ref 150–450)
RBC: 4.79 x10E6/uL (ref 3.77–5.28)
RDW: 14.6 % (ref 12.3–15.4)
WBC: 8.5 10*3/uL (ref 3.4–10.8)

## 2018-04-01 LAB — LIPID PANEL
Chol/HDL Ratio: 3.8 ratio (ref 0.0–4.4)
Cholesterol, Total: 133 mg/dL (ref 100–199)
HDL: 35 mg/dL — ABNORMAL LOW (ref >39)
LDL Calculated: 82 mg/dL (ref 0–99)
Triglycerides: 82 mg/dL (ref 0–149)
VLDL Cholesterol Cal: 16 mg/dL (ref 5–40)

## 2018-04-01 LAB — CMP14+EGFR
ALT: 11 IU/L (ref 0–32)
AST: 9 IU/L (ref 0–40)
Albumin/Globulin Ratio: 1.5 (ref 1.2–2.2)
Albumin: 4.4 g/dL (ref 3.5–5.5)
Alkaline Phosphatase: 51 IU/L (ref 39–117)
BUN/Creatinine Ratio: 19 (ref 9–23)
BUN: 13 mg/dL (ref 6–20)
Bilirubin Total: 0.5 mg/dL (ref 0.0–1.2)
CO2: 24 mmol/L (ref 20–29)
Calcium: 10.1 mg/dL (ref 8.7–10.2)
Chloride: 107 mmol/L — ABNORMAL HIGH (ref 96–106)
Creatinine, Ser: 0.7 mg/dL (ref 0.57–1.00)
GFR calc Af Amer: 127 mL/min/{1.73_m2} (ref >59)
GFR calc non Af Amer: 110 mL/min/{1.73_m2} (ref >59)
Globulin, Total: 3 g/dL (ref 1.5–4.5)
Glucose: 80 mg/dL (ref 65–99)
Potassium: 4.1 mmol/L (ref 3.5–5.2)
Sodium: 145 mmol/L — ABNORMAL HIGH (ref 134–144)
Total Protein: 7.4 g/dL (ref 6.0–8.5)

## 2018-04-01 LAB — TSH: TSH: 0.83 u[IU]/mL (ref 0.450–4.500)

## 2018-04-07 LAB — VITAMIN D 25 HYDROXY (VIT D DEFICIENCY, FRACTURES): Vit D, 25-Hydroxy: 23.5 ng/mL — ABNORMAL LOW (ref 30.0–100.0)

## 2018-04-07 LAB — SPECIMEN STATUS REPORT

## 2018-04-19 ENCOUNTER — Encounter: Payer: Self-pay | Admitting: *Deleted

## 2018-04-27 ENCOUNTER — Ambulatory Visit: Payer: 59 | Admitting: Family Medicine

## 2018-05-19 ENCOUNTER — Other Ambulatory Visit: Payer: Self-pay | Admitting: Family Medicine

## 2018-05-19 ENCOUNTER — Encounter: Payer: Self-pay | Admitting: Family Medicine

## 2018-05-19 DIAGNOSIS — F329 Major depressive disorder, single episode, unspecified: Secondary | ICD-10-CM

## 2018-05-19 DIAGNOSIS — K219 Gastro-esophageal reflux disease without esophagitis: Secondary | ICD-10-CM

## 2018-05-19 DIAGNOSIS — F5104 Psychophysiologic insomnia: Secondary | ICD-10-CM

## 2018-05-19 DIAGNOSIS — F419 Anxiety disorder, unspecified: Principal | ICD-10-CM

## 2018-05-19 DIAGNOSIS — F339 Major depressive disorder, recurrent, unspecified: Secondary | ICD-10-CM

## 2018-05-19 DIAGNOSIS — F32A Depression, unspecified: Secondary | ICD-10-CM

## 2018-05-19 MED ORDER — DESVENLAFAXINE SUCCINATE ER 25 MG PO TB24: 25.0000 mg | ORAL_TABLET | Freq: Every day | ORAL | 1 refills | Status: DC

## 2018-05-19 MED ORDER — OMEPRAZOLE 20 MG PO CPDR: DELAYED_RELEASE_CAPSULE | ORAL | 3 refills | Status: DC

## 2018-05-19 MED ORDER — BUPROPION HCL 75 MG PO TABS: 75.0000 mg | ORAL_TABLET | Freq: Every day | ORAL | 1 refills | Status: DC

## 2018-05-19 NOTE — Progress Notes (Signed)
Sent patient's medication refills

## 2018-07-25 ENCOUNTER — Other Ambulatory Visit: Payer: Self-pay | Admitting: Family Medicine

## 2018-07-31 ENCOUNTER — Encounter: Payer: 59 | Admitting: Gynecology

## 2018-08-29 ENCOUNTER — Ambulatory Visit: Payer: 59

## 2018-09-19 ENCOUNTER — Encounter: Payer: Self-pay | Admitting: Family Medicine

## 2018-09-19 ENCOUNTER — Ambulatory Visit: Payer: 59 | Admitting: Family Medicine

## 2018-09-19 VITALS — BP 129/79 | HR 51 | Temp 96.9°F | Ht 64.0 in | Wt 231.6 lb

## 2018-09-19 DIAGNOSIS — Z23 Encounter for immunization: Secondary | ICD-10-CM

## 2018-09-19 DIAGNOSIS — Z716 Tobacco abuse counseling: Secondary | ICD-10-CM

## 2018-09-19 DIAGNOSIS — F5104 Psychophysiologic insomnia: Secondary | ICD-10-CM

## 2018-09-19 DIAGNOSIS — K219 Gastro-esophageal reflux disease without esophagitis: Secondary | ICD-10-CM | POA: Diagnosis not present

## 2018-09-19 DIAGNOSIS — F339 Major depressive disorder, recurrent, unspecified: Secondary | ICD-10-CM | POA: Diagnosis not present

## 2018-09-19 MED ORDER — IBUPROFEN 800 MG PO TABS: ORAL_TABLET | ORAL | 11 refills | Status: DC

## 2018-09-19 MED ORDER — NICOTINE 7 MG/24HR TD PT24: 7.0000 mg | MEDICATED_PATCH | Freq: Every day | TRANSDERMAL | 0 refills | Status: DC

## 2018-09-19 MED ORDER — NICOTINE 14 MG/24HR TD PT24: 14.0000 mg | MEDICATED_PATCH | Freq: Every day | TRANSDERMAL | 0 refills | Status: DC

## 2018-09-19 MED ORDER — DESVENLAFAXINE SUCCINATE ER 25 MG PO TB24: 25.0000 mg | ORAL_TABLET | Freq: Every day | ORAL | 3 refills | Status: DC

## 2018-09-19 MED ORDER — OMEPRAZOLE 20 MG PO CPDR: DELAYED_RELEASE_CAPSULE | ORAL | 3 refills | Status: DC

## 2018-09-19 NOTE — Progress Notes (Signed)
BP 129/79   Pulse (!) 51   Temp (!) 96.9 F (36.1 C) (Oral)   Ht 5\' 4"  (1.626 m)   Wt 231 lb 9.6 oz (105.1 kg)   BMI 39.75 kg/m    Subjective:    Patient ID: Caroline Hopkins, female    DOB: 1980-12-16, 37 y.o.   MRN: [id number]  HPI: Caroline Hopkins is a 37 y.o. female presenting on 09/19/2018 for Depression (follow up)   HPI Depression recheck Patient is coming in today for depression and anxiety recheck.  She was on Pristiq previously but had run out and especially with things that she is been dealing with with her mother and her grandmother.  Her grandmother is going through hospice care right now and her mother is struggling emotionally with this and she is having to help with both.  Her grandmother is going through and stages of dementia and it has been worsening and progressing quickly to where they do not give her more than a couple weeks to live.  She has been having a lot of emotions and anxiety from this and is been affecting her at work again.  She would like to get back on the Pristiq.  She says she felt great on the Pristiq except for it did make her little more flat to where she did not feel like she can cry but she is satisfied with that and would like to continue it rather than trying something different.  Depression screen [facility] 2/9 09/19/2018 03/31/2018 01/19/2018 05/30/2017 04/14/2016  Decreased Interest 1 0 0 0 0  Down, Depressed, Hopeless 1 2 1  0 0  PHQ - 2 Score 2 2 1  0 0  Altered sleeping 2 3 - - -  Tired, decreased energy 2 3 - - -  Change in appetite 1 1 - - -  Feeling bad or failure about yourself  0 0 - - -  Trouble concentrating 1 1 - - -  Moving slowly or fidgety/restless 0 0 - - -  Suicidal thoughts 0 0 - - -  PHQ-9 Score 8 10 - - -  Difficult doing work/chores - - - - -     GERD Patient is currently on omeprazole.  She denies any major symptoms or abdominal pain or belching or burping. She denies any blood in her stool or lightheadedness or dizziness.    Patient wants to discuss tobacco cessation at this point.  She had quit but restarted again and she would like to get nicotine patches to help her quit again.  She is currently smoking between a half and three quarters of pack per day  Relevant past medical, surgical, family and social history reviewed and updated as indicated. Interim medical history since our last visit reviewed. Allergies and medications reviewed and updated.  Review of Systems  Constitutional: Negative for chills and fever.  Eyes: Negative for visual disturbance.  Respiratory: Negative for chest tightness and shortness of breath.   Cardiovascular: Negative for chest pain and leg swelling.  Musculoskeletal: Negative for back pain and gait problem.  Skin: Negative for rash.  Neurological: Negative for light-headedness and headaches.  Psychiatric/Behavioral: Positive for dysphoric mood and sleep disturbance. Negative for agitation, behavioral problems, decreased concentration, self-injury and suicidal ideas. The patient is nervous/anxious.   All other systems reviewed and are negative.   Per HPI unless specifically indicated above   Allergies as of 09/19/2018      Reactions   Rocephin [ceftriaxone] Anaphylaxis  Latex Other (See Comments)   Blisters    Oxycodone Nausea And Vomiting      Medication List        Accurate as of 09/19/18  3:53 PM. Always use your most recent med list.          Desvenlafaxine Succinate ER 25 MG Tb24 Take 25 mg by mouth daily.   ibuprofen 800 MG tablet Commonly known as:  ADVIL,MOTRIN TAKE ONE TABLET EVERY 8 HOURS AS NEEDED   nicotine 14 mg/24hr patch Commonly known as:  NICODERM CQ - dosed in mg/24 hours Place 1 patch (14 mg total) onto the skin daily.   nicotine 7 mg/24hr patch Commonly known as:  NICODERM CQ - dosed in mg/24 hr Place 1 patch (7 mg total) onto the skin daily.   omeprazole 20 MG capsule Commonly known as:  PRILOSEC TAKE ONE (1) CAPSULE EACH DAY           Objective:    BP 129/79   Pulse (!) 51   Temp (!) 96.9 F (36.1 C) (Oral)   Ht 5\' 4"  (1.626 m)   Wt 231 lb 9.6 oz (105.1 kg)   BMI 39.75 kg/m   Wt Readings from Last 3 Encounters:  09/19/18 231 lb 9.6 oz (105.1 kg)  03/31/18 231 lb (104.8 kg)  01/19/18 239 lb (108.4 kg)    Physical Exam  Constitutional: She is oriented to person, place, and time. She appears well-developed and well-nourished. No distress.  Eyes: Conjunctivae are normal.  Neck: Neck supple. No thyromegaly present.  Cardiovascular: Normal rate, regular rhythm, normal heart sounds and intact distal pulses.  No murmur heard. Pulmonary/Chest: Effort normal and breath sounds normal. No respiratory distress. She has no wheezes.  Musculoskeletal: Normal range of motion. She exhibits no edema.  Lymphadenopathy:    She has no cervical adenopathy.  Neurological: She is alert and oriented to person, place, and time. Coordination normal.  Skin: Skin is warm and dry. No rash noted. She is not diaphoretic.  Psychiatric: Her behavior is normal. Her mood appears anxious. She exhibits a depressed mood. She expresses no suicidal ideation. She expresses no suicidal plans.  Nursing note and vitals reviewed.       Assessment & Plan:   Problem List Items Addressed This Visit      Digestive   GERD (gastroesophageal reflux disease)   Relevant Medications   omeprazole (PRILOSEC) 20 MG capsule     Other   Depression, recurrent (H[facility]) - Primary   Relevant Medications   Desvenlafaxine Succinate ER (PRISTIQ) 25 MG TB24   Insomnia   Relevant Medications   Desvenlafaxine Succinate ER (PRISTIQ) 25 MG TB24    Other Visit Diagnoses    Need for immunization against influenza       Relevant Orders   Flu Vaccine QUAD 36+ mos IM (Completed)   Encounter for smoking cessation counseling       Relevant Medications   nicotine (NICODERM CQ - DOSED IN MG/24 HOURS) 14 mg/24hr patch   nicotine (NICODERM CQ - DOSED IN MG/24 HR)  7 mg/24hr patch      We will restart her Pristiq because she definitely feels like she needs it and has been out of it, continue omeprazole and will give nicotine patches for smoking cessation Follow up plan: Return in about 3 months (around 12/20/2018), or if symptoms worsen or fail to improve, for Patient needs Pap scheduled ASAP and 3 months recheck for depression.  Counseling provided for all of  the vaccine components Orders Placed This Encounter  Procedures  . Flu Vaccine QUAD 36+ mos IM    Arville CareJoshua Ezri Fanguy, MD [facility] 09/19/2018, 3:53 PM

## 2018-10-20 ENCOUNTER — Encounter: Payer: 59 | Admitting: Family Medicine

## 2018-10-24 DIAGNOSIS — Z1231 Encounter for screening mammogram for malignant neoplasm of breast: Secondary | ICD-10-CM | POA: Diagnosis not present

## 2018-10-24 LAB — HM MAMMOGRAPHY

## 2018-12-01 ENCOUNTER — Ambulatory Visit (INDEPENDENT_AMBULATORY_CARE_PROVIDER_SITE_OTHER): Payer: 59 | Admitting: Family Medicine

## 2018-12-01 ENCOUNTER — Encounter: Payer: Self-pay | Admitting: Family Medicine

## 2018-12-01 VITALS — BP 137/97 | HR 71 | Temp 98.1°F | Ht 64.0 in | Wt 239.4 lb

## 2018-12-01 DIAGNOSIS — Z01411 Encounter for gynecological examination (general) (routine) with abnormal findings: Secondary | ICD-10-CM | POA: Diagnosis not present

## 2018-12-01 DIAGNOSIS — Z23 Encounter for immunization: Secondary | ICD-10-CM

## 2018-12-01 DIAGNOSIS — R5383 Other fatigue: Secondary | ICD-10-CM

## 2018-12-01 DIAGNOSIS — E559 Vitamin D deficiency, unspecified: Secondary | ICD-10-CM | POA: Diagnosis not present

## 2018-12-01 DIAGNOSIS — M545 Low back pain, unspecified: Secondary | ICD-10-CM

## 2018-12-01 DIAGNOSIS — Z01419 Encounter for gynecological examination (general) (routine) without abnormal findings: Secondary | ICD-10-CM

## 2018-12-01 LAB — URINALYSIS, COMPLETE
Bilirubin, UA: NEGATIVE
Glucose, UA: NEGATIVE
Ketones, UA: NEGATIVE
Leukocytes, UA: NEGATIVE
Nitrite, UA: NEGATIVE
Protein, UA: NEGATIVE
Specific Gravity, UA: 1.02 (ref 1.005–1.030)
Urobilinogen, Ur: 0.2 mg/dL (ref 0.2–1.0)
pH, UA: 7 (ref 5.0–7.5)

## 2018-12-01 LAB — MICROSCOPIC EXAMINATION: Renal Epithel, UA: NONE SEEN /hpf

## 2018-12-01 NOTE — Progress Notes (Signed)
BP (!) 137/97   Pulse 71   Temp 98.1 F (36.7 C) (Oral)   Ht 5\' 4"  (1.626 m)   Wt 239 lb 6.4 oz (108.6 kg)   BMI 41.09 kg/m    Subjective:    Patient ID: Caroline Hopkins, female    DOB: 1981-07-06, 38 y.o.   MRN: [id number]  HPI: Caroline Hopkins is a 38 y.o. female presenting on 12/01/2018 for Gynecologic Exam; Fatigue (x 1 month . Patient states she also has problems with remembering things. Stree related ?); and Back Pain   HPI Well woman exam routine gynecological exam Patient is coming in today for well woman exam and routine gynecological exam.  She has been having fatigue and some intermittent right lower back pain/flank pain is been going on over the past month.  She says her fatigue is been progressively worsening and she just feels tired and sleeps all the time.  She does admit that she had a loss in her grandmother a month ago and that is been hard for her to deal with and that something that she has been having to go through.  She denies any major feelings of depression and feels like the antianxiety/antidepressant that she is on is helping for the most part but she is just down in energy and does not have the motivation currently.  She denies any other health issues currently. Patient denies any chest pain, shortness of breath, headaches or vision issues, abdominal complaints, diarrhea, nausea, vomiting, or joint issues.  Any nipple discharge or breast changes or lumps.  She denies any vaginal issues.  Relevant past medical, surgical, family and social history reviewed and updated as indicated. Interim medical history since our last visit reviewed. Allergies and medications reviewed and updated.  Review of Systems  Constitutional: Positive for fatigue. Negative for chills and fever.  HENT: Negative for congestion, ear discharge, ear pain and tinnitus.   Eyes: Negative for pain, redness and visual disturbance.  Respiratory: Negative for cough, chest tightness, shortness of  breath and wheezing.   Cardiovascular: Negative for chest pain, palpitations and leg swelling.  Gastrointestinal: Negative for abdominal pain, blood in stool, constipation and diarrhea.  Genitourinary: Negative for difficulty urinating, dysuria and hematuria.  Musculoskeletal: Negative for back pain, gait problem and myalgias.  Skin: Negative for rash.  Neurological: Negative for dizziness, weakness, light-headedness and headaches.  Psychiatric/Behavioral: Negative for agitation, behavioral problems and suicidal ideas.  All other systems reviewed and are negative.   Per HPI unless specifically indicated above   Allergies as of 12/01/2018      Reactions   Rocephin [ceftriaxone] Anaphylaxis   Latex Other (See Comments)   Blisters    Oxycodone Nausea And Vomiting      Medication List       Accurate as of December 01, 2018  3:27 PM. Always use your most recent med list.        Desvenlafaxine Succinate ER 25 MG Tb24 Commonly known as:  PRISTIQ Take 25 mg by mouth daily.   ibuprofen 800 MG tablet Commonly known as:  ADVIL,MOTRIN TAKE ONE TABLET EVERY 8 HOURS AS NEEDED   omeprazole 20 MG capsule Commonly known as:  PRILOSEC TAKE ONE (1) CAPSULE EACH DAY          Objective:    BP (!) 137/97   Pulse 71   Temp 98.1 F (36.7 C) (Oral)   Ht 5\' 4"  (1.626 m)   Wt 239 lb 6.4 oz (108.6 kg)  BMI 41.09 kg/m   Wt Readings from Last 3 Encounters:  12/01/18 239 lb 6.4 oz (108.6 kg)  09/19/18 231 lb 9.6 oz (105.1 kg)  03/31/18 231 lb (104.8 kg)    Physical Exam Vitals signs and nursing note reviewed.  Constitutional:      General: She is not in acute distress.    Appearance: She is well-developed. She is not diaphoretic.  Eyes:     Conjunctiva/sclera: Conjunctivae normal.     Pupils: Pupils are equal, round, and reactive to light.  Neck:     Musculoskeletal: Neck supple.     Thyroid: No thyromegaly.  Cardiovascular:     Rate and Rhythm: Normal rate and regular rhythm.      Heart sounds: Normal heart sounds. No murmur.  Pulmonary:     Effort: Pulmonary effort is normal. No respiratory distress.     Breath sounds: Normal breath sounds. No wheezing.  Chest:     Breasts: Breasts are symmetrical.        Right: No inverted nipple, mass, nipple discharge, skin change or tenderness.        Left: No inverted nipple, mass, nipple discharge, skin change or tenderness.  Abdominal:     General: Bowel sounds are normal. There is no distension.     Palpations: Abdomen is soft.     Tenderness: There is no abdominal tenderness. There is no guarding or rebound.  Genitourinary:    Exam position: Supine.     Labia:        Right: No rash or lesion.        Left: No rash or lesion.      Vagina: Normal.     Cervix: No cervical motion tenderness, discharge or friability.     Uterus: Not deviated, not enlarged, not fixed and not tender.      Adnexa:        Right: No mass or tenderness.         Left: No mass or tenderness.       Musculoskeletal: Normal range of motion.        General: No tenderness (No tenderness on exam in lower back).  Lymphadenopathy:     Cervical: No cervical adenopathy.  Skin:    General: Skin is warm and dry.     Findings: No rash.  Neurological:     Mental Status: She is alert and oriented to person, place, and time.     Coordination: Coordination normal.  Psychiatric:        Behavior: Behavior normal.    Urinalysis: Trace bacteria and 0-10 epithelial cells but otherwise normal    Assessment & Plan:   Problem List Items Addressed This Visit    None    Visit Diagnoses    Well woman exam with routine gynecological exam    -  Primary   Relevant Orders   CBC with Differential/Platelet   Low back pain, unspecified back pain laterality, unspecified chronicity, unspecified whether sciatica present       Relevant Orders   Urinalysis, Complete   Vitamin D deficiency       Relevant Orders   VITAMIN D 25 Hydroxy (Vit-D Deficiency,  Fractures)   Other fatigue       Relevant Orders   TSH      Back pain seems more to be muscular in nature and her fatigue to something we will do some blood work for and then recommended counseling because it very well could be related  to her mood. Follow up plan: Return if symptoms worsen or fail to improve.  Counseling provided for all of the vaccine components Orders Placed This Encounter  Procedures  . Urinalysis, Complete  . CBC with Differential/Platelet  . VITAMIN D 25 Hydroxy (Vit-D Deficiency, Fractures)  . TSH    Arville Care, MD [facility] 12/01/2018, 3:27 PM

## 2018-12-01 NOTE — Addendum Note (Signed)
Addended by: Angela Adam on: 12/01/2018 03:54 PM   Modules accepted: Orders

## 2018-12-01 NOTE — Addendum Note (Signed)
Addended by: Angela Adam on: 12/01/2018 03:52 PM   Modules accepted: Orders

## 2018-12-02 LAB — CBC WITH DIFFERENTIAL/PLATELET
Basophils Absolute: 0.1 10*3/uL (ref 0.0–0.2)
Basos: 1 %
EOS (ABSOLUTE): 0.3 10*3/uL (ref 0.0–0.4)
Eos: 3 %
Hematocrit: 40.4 % (ref 34.0–46.6)
Hemoglobin: 13.9 g/dL (ref 11.1–15.9)
Immature Grans (Abs): 0 10*3/uL (ref 0.0–0.1)
Immature Granulocytes: 0 %
Lymphocytes Absolute: 2.5 10*3/uL (ref 0.7–3.1)
Lymphs: 23 %
MCH: 31 pg (ref 26.6–33.0)
MCHC: 34.4 g/dL (ref 31.5–35.7)
MCV: 90 fL (ref 79–97)
Monocytes Absolute: 0.5 10*3/uL (ref 0.1–0.9)
Monocytes: 5 %
Neutrophils Absolute: 7.4 10*3/uL — ABNORMAL HIGH (ref 1.4–7.0)
Neutrophils: 68 %
Platelets: 257 10*3/uL (ref 150–450)
RBC: 4.49 x10E6/uL (ref 3.77–5.28)
RDW: 12.4 % (ref 11.7–15.4)
WBC: 10.8 10*3/uL (ref 3.4–10.8)

## 2018-12-02 LAB — VITAMIN D 25 HYDROXY (VIT D DEFICIENCY, FRACTURES): Vit D, 25-Hydroxy: 16.5 ng/mL — ABNORMAL LOW (ref 30.0–100.0)

## 2018-12-02 LAB — TSH: TSH: 0.841 u[IU]/mL (ref 0.450–4.500)

## 2018-12-06 LAB — PAP IG W/ RFLX HPV ASCU

## 2018-12-08 ENCOUNTER — Telehealth: Payer: Self-pay | Admitting: Family Medicine

## 2018-12-08 DIAGNOSIS — R87612 Low grade squamous intraepithelial lesion on cytologic smear of cervix (LGSIL): Secondary | ICD-10-CM

## 2018-12-08 NOTE — Telephone Encounter (Signed)
Discussed results of Pap smear with the patient and that it is low-grade squamous intraepithelial lesion with unknown HPV status, will send patient to gynecology for possible colposcopy Arville Care, MD [facility] 12/08/2018, 3:48 PM

## 2018-12-12 ENCOUNTER — Telehealth: Payer: Self-pay | Admitting: Family Medicine

## 2018-12-12 NOTE — Telephone Encounter (Signed)
I talked to the lab and her reflex on the HPV did come back HPV positive so she does need to go ahead and see them

## 2018-12-12 NOTE — Telephone Encounter (Signed)
Pt. Aware.

## 2018-12-12 NOTE — Telephone Encounter (Signed)
Dr. Algis Downs,  Can you take a look - she said that you called her (in notes) and said that she would to GYN, then got a call later that you were able to add-on labs and she may not have to go???  She has appt for MON with GYN - should she keep it ot cancel?

## 2018-12-15 LAB — SPECIMEN STATUS REPORT

## 2018-12-15 LAB — HPV DNA PROBE HIGH RISK, AMPLIFIED: HPV, high-risk: NEGATIVE

## 2018-12-15 NOTE — Telephone Encounter (Signed)
Pt scheduled, then appointment cancelled due to incorrect lab results

## 2018-12-18 ENCOUNTER — Ambulatory Visit: Payer: 59 | Admitting: Gynecology

## 2019-01-18 ENCOUNTER — Ambulatory Visit: Payer: 59 | Admitting: Gynecology

## 2019-03-14 ENCOUNTER — Telehealth (INDEPENDENT_AMBULATORY_CARE_PROVIDER_SITE_OTHER): Payer: 59 | Admitting: Family Medicine

## 2019-03-14 ENCOUNTER — Encounter: Payer: Self-pay | Admitting: Family Medicine

## 2019-03-14 ENCOUNTER — Other Ambulatory Visit: Payer: Self-pay

## 2019-03-14 DIAGNOSIS — L209 Atopic dermatitis, unspecified: Secondary | ICD-10-CM | POA: Diagnosis not present

## 2019-03-14 MED ORDER — PREDNISONE 20 MG PO TABS: ORAL_TABLET | ORAL | 0 refills | Status: DC

## 2019-03-14 MED ORDER — TRIAMCINOLONE ACETONIDE 0.1 % EX CREA: 1.0000 "application " | TOPICAL_CREAM | Freq: Two times a day (BID) | CUTANEOUS | 0 refills | Status: DC

## 2019-03-14 NOTE — Progress Notes (Signed)
Virtual Visit via video MyChart note  I connected with Caroline Hopkins on 03/14/19 at 1512 by video and verified that I am speaking with the correct person using two identifiers. Caroline Hopkins is currently located at home and no other people are currently with her during visit. The provider, Elige Radon Dettinger, MD is located in their office at time of visit.  Call ended at 1520  I discussed the limitations, risks, security and privacy concerns of performing an evaluation and management service by video and the availability of in person appointments. I also discussed with the patient that there may be a patient responsible charge related to this service. The patient expressed understanding and agreed to proceed.   History and Present Illness: Patient is calling in for a rash on arms and neck and scalp.  Changed detergent and feels like that is what flared up. She has pruritis. She has burning with warm water and anything that irritates it.  She feels like it is her sensitive skin flaring up and has been trying to use cortisone and lotions and moisturizers and does not feel like they are helping.  She still tends to get like this when she does have an irritant with something and she feels like the change in detergent is what caused it on her.  She denies any fevers or chills or shortness of breath or wheezing.  She denies any pus or purulent drainage.  She says she will get so itchy that when she scratches it will turn into welts and then will bleed some with the scratching.  She says she is scratching at night when she does any but no it.  She says her current medications are not working and when she gets like this was helped her before it is the prednisone and triamcinolone.  She denies any redness or warmth  No diagnosis found.  Outpatient Encounter Medications as of 03/14/2019  Medication Sig  . Desvenlafaxine Succinate ER (PRISTIQ) 25 MG TB24 Take 25 mg by mouth daily.  Marland Kitchen ibuprofen  (ADVIL,MOTRIN) 800 MG tablet TAKE ONE TABLET EVERY 8 HOURS AS NEEDED  . omeprazole (PRILOSEC) 20 MG capsule TAKE ONE (1) CAPSULE EACH DAY   No facility-administered encounter medications on file as of 03/14/2019.     Review of Systems  Constitutional: Negative for chills and fever.  Eyes: Negative for visual disturbance.  Respiratory: Negative for chest tightness and shortness of breath.   Cardiovascular: Negative for chest pain and leg swelling.  Musculoskeletal: Negative for back pain and gait problem.  Skin: Positive for rash. Negative for color change.  Neurological: Negative for light-headedness and headaches.  Psychiatric/Behavioral: Negative for agitation and behavioral problems.  All other systems reviewed and are negative.   Observations/Objective: On video visit patient is able to show me a couple of the spots and they do look like raised small fine pink papules with excoriations.  Assessment and Plan: Problem List Items Addressed This Visit    None    Visit Diagnoses    Atopic dermatitis, unspecified type    -  Primary   Relevant Medications   predniSONE (DELTASONE) 20 MG tablet   triamcinolone cream (KENALOG) 0.1 %       Follow Up Instructions: As needed  Sent in prednisone and triamcinolone for the patient   I discussed the assessment and treatment plan with the patient. The patient was provided an opportunity to ask questions and all were answered. The patient agreed with the plan  and demonstrated an understanding of the instructions.   The patient was advised to call back or seek an in-person evaluation if the symptoms worsen or if the condition fails to improve as anticipated.  The above assessment and management plan was discussed with the patient. The patient verbalized understanding of and has agreed to the management plan. Patient is aware to call the clinic if symptoms persist or worsen. Patient is aware when to return to the clinic for a follow-up visit.  Patient educated on when it is appropriate to go to the emergency department.    I provided 8 minutes of non-face-to-face time during this encounter.    Nils PyleJoshua A Dettinger, MD

## 2019-07-13 ENCOUNTER — Telehealth: Payer: Self-pay | Admitting: Family Medicine

## 2019-07-13 NOTE — Telephone Encounter (Signed)
appt scheduled Pt notified 

## 2019-07-31 ENCOUNTER — Encounter: Payer: Self-pay | Admitting: Gynecology

## 2019-08-17 ENCOUNTER — Ambulatory Visit: Payer: 59 | Admitting: Family Medicine

## 2019-09-13 ENCOUNTER — Other Ambulatory Visit: Payer: Self-pay

## 2019-09-14 ENCOUNTER — Ambulatory Visit (INDEPENDENT_AMBULATORY_CARE_PROVIDER_SITE_OTHER): Payer: 59 | Admitting: *Deleted

## 2019-09-14 ENCOUNTER — Other Ambulatory Visit: Payer: Self-pay

## 2019-09-14 DIAGNOSIS — Z23 Encounter for immunization: Secondary | ICD-10-CM

## 2019-11-29 ENCOUNTER — Other Ambulatory Visit: Payer: Self-pay | Admitting: Family Medicine

## 2019-12-31 ENCOUNTER — Other Ambulatory Visit: Payer: Self-pay | Admitting: Family Medicine

## 2019-12-31 DIAGNOSIS — L209 Atopic dermatitis, unspecified: Secondary | ICD-10-CM

## 2020-04-01 ENCOUNTER — Encounter: Payer: Self-pay | Admitting: Family Medicine

## 2020-06-20 ENCOUNTER — Ambulatory Visit (INDEPENDENT_AMBULATORY_CARE_PROVIDER_SITE_OTHER): Payer: No Typology Code available for payment source | Admitting: Family Medicine

## 2020-06-20 ENCOUNTER — Encounter: Payer: Self-pay | Admitting: Family Medicine

## 2020-06-20 DIAGNOSIS — F5104 Psychophysiologic insomnia: Secondary | ICD-10-CM

## 2020-06-20 DIAGNOSIS — Z975 Presence of (intrauterine) contraceptive device: Secondary | ICD-10-CM

## 2020-06-20 DIAGNOSIS — N921 Excessive and frequent menstruation with irregular cycle: Secondary | ICD-10-CM | POA: Diagnosis not present

## 2020-06-20 DIAGNOSIS — K219 Gastro-esophageal reflux disease without esophagitis: Secondary | ICD-10-CM | POA: Diagnosis not present

## 2020-06-20 DIAGNOSIS — F339 Major depressive disorder, recurrent, unspecified: Secondary | ICD-10-CM

## 2020-06-20 DIAGNOSIS — Z13220 Encounter for screening for lipoid disorders: Secondary | ICD-10-CM

## 2020-06-20 MED ORDER — NORETHINDRONE 0.35 MG PO TABS: 1.0000 | ORAL_TABLET | Freq: Every day | ORAL | 1 refills | Status: DC

## 2020-06-20 MED ORDER — OMEPRAZOLE 20 MG PO CPDR: DELAYED_RELEASE_CAPSULE | ORAL | 3 refills | Status: DC

## 2020-06-20 MED ORDER — DESVENLAFAXINE SUCCINATE ER 25 MG PO TB24: 50.0000 mg | ORAL_TABLET | Freq: Every day | ORAL | 2 refills | Status: DC

## 2020-06-20 MED ORDER — IBUPROFEN 800 MG PO TABS: ORAL_TABLET | ORAL | 5 refills | Status: DC

## 2020-06-20 NOTE — Progress Notes (Signed)
Virtual Visit via telephone Note  I connected with Caroline Hopkins on 06/20/20 at 0910 by telephone and verified that I am speaking with the correct person using two identifiers. Caroline Hopkins is currently located at home and no other people are currently with her during visit. The provider, Fransisca Kaufmann Neomi Laidler, MD is located in their office at time of visit.  Call ended at [phone]  I discussed the limitations, risks, security and privacy concerns of performing an evaluation and management service by telephone and the availability of in person appointments. I also discussed with the patient that there may be a patient responsible charge related to this service. The patient expressed understanding and agreed to proceed.   History and Present Illness: Patient is calling in for depression recheck She is currently taking nothing to help, she is having a lot of anxiety and not doing well with the anxiety.  She denies any suicidal ideations.   GERD Patient is currently on omeprazole.  She denies any major symptoms or abdominal pain or belching or burping. She denies any blood in her stool or lightheadedness or dizziness.   Patient is having menstrual irregularities and had IUD placed 4 years ago and was doing well with no cycles and now is having frequent bleeding for weeks at a time. She is having cramping and pain throughout those weeks.   No diagnosis found.  Outpatient Encounter Medications as of 06/20/2020  Medication Sig  . Desvenlafaxine Succinate ER (PRISTIQ) 25 MG TB24 Take 25 mg by mouth daily.  Marland Kitchen ibuprofen (ADVIL) 800 MG tablet TAKE ONE TABLET EVERY 8 HOURS AS NEEDED (Needs to be seen before next refill)  . omeprazole (PRILOSEC) 20 MG capsule TAKE ONE (1) CAPSULE EACH DAY  . predniSONE (DELTASONE) 20 MG tablet 2 po at same time daily for 5 days  . triamcinolone cream (KENALOG) 0.1 % Apply 1 application topically 2 (two) times daily.   No facility-administered encounter medications  on file as of 06/20/2020.    Review of Systems  Constitutional: Negative for chills and fever.  Eyes: Negative for visual disturbance.  Respiratory: Negative for chest tightness and shortness of breath.   Cardiovascular: Negative for chest pain and leg swelling.  Musculoskeletal: Negative for back pain and gait problem.  Skin: Negative for rash.  Neurological: Negative for light-headedness and headaches.  Psychiatric/Behavioral: Positive for dysphoric mood. Negative for agitation, behavioral problems, decreased concentration, self-injury, sleep disturbance and suicidal ideas. The patient is nervous/anxious.   All other systems reviewed and are negative.   Observations/Objective: Patient sounds comfortable and in no acute distress  Assessment and Plan: Problem List Items Addressed This Visit      Digestive   GERD (gastroesophageal reflux disease)   Relevant Medications   omeprazole (PRILOSEC) 20 MG capsule   Other Relevant Orders   CBC with Differential/Platelet   Vitamin B12   VITAMIN D 25 Hydroxy (Vit-D Deficiency, Fractures)     Other   Depression, recurrent (HCC) - Primary   Relevant Medications   Desvenlafaxine Succinate ER (PRISTIQ) 25 MG TB24   Other Relevant Orders   CBC with Differential/Platelet   CMP14+EGFR   TSH   Vitamin B12   VITAMIN D 25 Hydroxy (Vit-D Deficiency, Fractures)   Insomnia   Relevant Medications   Desvenlafaxine Succinate ER (PRISTIQ) 25 MG TB24   Other Relevant Orders   TSH   Vitamin B12   VITAMIN D 25 Hydroxy (Vit-D Deficiency, Fractures)    Other Visit Diagnoses  Breakthrough bleeding with IUD       Relevant Medications   norethindrone (ORTHO MICRONOR) 0.35 MG tablet   Other Relevant Orders   Vitamin B12   VITAMIN D 25 Hydroxy (Vit-D Deficiency, Fractures)   Lipid screening       Relevant Orders   Lipid panel      Patient is having breakthrough bleeding on her IUD over this past year, we will try a short course of oral  progesterone, she says she cannot tolerate the oral combination pills.  She has an OB appointment next month  Patient's anxiety is increased and will add Pristiq back and see how she does on it, may consider BuSpar in the future as well.  Refills on Prilosec and ibuprofen Follow up plan: Return in about 5 weeks (around 07/25/2020), or if symptoms worsen or fail to improve, for anxiety and depression.     I discussed the assessment and treatment plan with the patient. The patient was provided an opportunity to ask questions and all were answered. The patient agreed with the plan and demonstrated an understanding of the instructions.   The patient was advised to call back or seek an in-person evaluation if the symptoms worsen or if the condition fails to improve as anticipated.  The above assessment and management plan was discussed with the patient. The patient verbalized understanding of and has agreed to the management plan. Patient is aware to call the clinic if symptoms persist or worsen. Patient is aware when to return to the clinic for a follow-up visit. Patient educated on when it is appropriate to go to the emergency department.    I provided 15 minutes of non-face-to-face time during this encounter.    Worthy Rancher, MD

## 2020-07-11 ENCOUNTER — Other Ambulatory Visit: Payer: No Typology Code available for payment source

## 2020-07-11 ENCOUNTER — Other Ambulatory Visit: Payer: Self-pay

## 2020-07-11 DIAGNOSIS — Z975 Presence of (intrauterine) contraceptive device: Secondary | ICD-10-CM

## 2020-07-11 DIAGNOSIS — K219 Gastro-esophageal reflux disease without esophagitis: Secondary | ICD-10-CM

## 2020-07-11 DIAGNOSIS — N921 Excessive and frequent menstruation with irregular cycle: Secondary | ICD-10-CM

## 2020-07-11 DIAGNOSIS — F339 Major depressive disorder, recurrent, unspecified: Secondary | ICD-10-CM

## 2020-07-11 DIAGNOSIS — F5104 Psychophysiologic insomnia: Secondary | ICD-10-CM

## 2020-07-11 DIAGNOSIS — Z13220 Encounter for screening for lipoid disorders: Secondary | ICD-10-CM

## 2020-07-12 LAB — CBC WITH DIFFERENTIAL/PLATELET
Basophils Absolute: 0.1 10*3/uL (ref 0.0–0.2)
Basos: 1 %
EOS (ABSOLUTE): 0.4 10*3/uL (ref 0.0–0.4)
Eos: 5 %
Hematocrit: 42.5 % (ref 34.0–46.6)
Hemoglobin: 13.9 g/dL (ref 11.1–15.9)
Immature Grans (Abs): 0 10*3/uL (ref 0.0–0.1)
Immature Granulocytes: 0 %
Lymphocytes Absolute: 2.1 10*3/uL (ref 0.7–3.1)
Lymphs: 24 %
MCH: 30.2 pg (ref 26.6–33.0)
MCHC: 32.7 g/dL (ref 31.5–35.7)
MCV: 92 fL (ref 79–97)
Monocytes Absolute: 0.5 10*3/uL (ref 0.1–0.9)
Monocytes: 6 %
Neutrophils Absolute: 5.5 10*3/uL (ref 1.4–7.0)
Neutrophils: 64 %
Platelets: 247 10*3/uL (ref 150–450)
RBC: 4.6 x10E6/uL (ref 3.77–5.28)
RDW: 13 % (ref 11.7–15.4)
WBC: 8.6 10*3/uL (ref 3.4–10.8)

## 2020-07-12 LAB — CMP14+EGFR
ALT: 9 IU/L (ref 0–32)
AST: 11 IU/L (ref 0–40)
Albumin/Globulin Ratio: 1.6 (ref 1.2–2.2)
Albumin: 4.2 g/dL (ref 3.8–4.8)
Alkaline Phosphatase: 49 IU/L (ref 48–121)
BUN/Creatinine Ratio: 17 (ref 9–23)
BUN: 10 mg/dL (ref 6–24)
Bilirubin Total: 0.5 mg/dL (ref 0.0–1.2)
CO2: 25 mmol/L (ref 20–29)
Calcium: 9.4 mg/dL (ref 8.7–10.2)
Chloride: 106 mmol/L (ref 96–106)
Creatinine, Ser: 0.59 mg/dL (ref 0.57–1.00)
GFR calc Af Amer: 133 mL/min/{1.73_m2} (ref >59)
GFR calc non Af Amer: 115 mL/min/{1.73_m2} (ref >59)
Globulin, Total: 2.7 g/dL (ref 1.5–4.5)
Glucose: 86 mg/dL (ref 65–99)
Potassium: 4.3 mmol/L (ref 3.5–5.2)
Sodium: 143 mmol/L (ref 134–144)
Total Protein: 6.9 g/dL (ref 6.0–8.5)

## 2020-07-12 LAB — LIPID PANEL
Chol/HDL Ratio: 4.1 ratio (ref 0.0–4.4)
Cholesterol, Total: 150 mg/dL (ref 100–199)
HDL: 37 mg/dL — ABNORMAL LOW (ref >39)
LDL Chol Calc (NIH): 94 mg/dL (ref 0–99)
Triglycerides: 99 mg/dL (ref 0–149)
VLDL Cholesterol Cal: 19 mg/dL (ref 5–40)

## 2020-07-12 LAB — VITAMIN D 25 HYDROXY (VIT D DEFICIENCY, FRACTURES): Vit D, 25-Hydroxy: 26.2 ng/mL — ABNORMAL LOW (ref 30.0–100.0)

## 2020-07-12 LAB — VITAMIN B12: Vitamin B-12: 183 pg/mL — ABNORMAL LOW (ref 232–1245)

## 2020-07-12 LAB — TSH: TSH: 1.11 u[IU]/mL (ref 0.450–4.500)

## 2020-07-16 ENCOUNTER — Other Ambulatory Visit: Payer: Self-pay

## 2020-07-16 ENCOUNTER — Encounter: Payer: Self-pay | Admitting: Obstetrics and Gynecology

## 2020-07-16 ENCOUNTER — Ambulatory Visit (INDEPENDENT_AMBULATORY_CARE_PROVIDER_SITE_OTHER): Payer: No Typology Code available for payment source | Admitting: Obstetrics and Gynecology

## 2020-07-16 VITALS — BP 126/80 | Ht 64.0 in | Wt 232.0 lb

## 2020-07-16 DIAGNOSIS — N939 Abnormal uterine and vaginal bleeding, unspecified: Secondary | ICD-10-CM | POA: Diagnosis not present

## 2020-07-16 DIAGNOSIS — T8332XA Displacement of intrauterine contraceptive device, initial encounter: Secondary | ICD-10-CM | POA: Diagnosis not present

## 2020-07-16 MED ORDER — NICOTINE 14 MG/24HR TD PT24: 14.0000 mg | MEDICATED_PATCH | Freq: Every day | TRANSDERMAL | 11 refills | Status: DC

## 2020-07-16 NOTE — Progress Notes (Signed)
SULEMA BRAID 1981/10/11 [id number]  SUBJECTIVE:  39 y.o. G2P2 female last seen in this office in 2018 presents for abnormal uterine bleeding.  She had a Mirena IUD placed 04/2017, she has a history of heavy and irregular periods but the IUD really helps and after the first few months she was essentially amenorrheic until this last December about 9 months ago.  She started having fairly drastic change in her bleeding pattern with irregular bleeding most days since then.  Typically has a lot of abdominal cramping and this is been worse last several months.  She reports 2 prior C-sections and a tubal ligation after her last delivery.  Her primary doctor had put her on the minipill to help with trying to reduce the irregular bleeding which has not really worked.  She is a smoker about 1/2 ppd and has successfully quit in the past.  She works as a Camera operator.   Current Outpatient Medications  Medication Sig Dispense Refill  . Desvenlafaxine Succinate ER (PRISTIQ) 25 MG TB24 Take 50 mg by mouth daily. 60 tablet 2  . ibuprofen (ADVIL) 800 MG tablet TAKE ONE TABLET EVERY 8 HOURS AS NEEDED (Needs to be seen before next refill) 30 tablet 5  . norethindrone (ORTHO MICRONOR) 0.35 MG tablet Take 1 tablet (0.35 mg total) by mouth daily. 28 tablet 1  . omeprazole (PRILOSEC) 20 MG capsule TAKE ONE (1) CAPSULE EACH DAY 90 capsule 3   No current facility-administered medications for this visit.   Allergies: Rocephin [ceftriaxone], Latex, Oxycodone, and Wellbutrin [bupropion]  Patient's last menstrual period was 07/02/2020.  Past medical history,surgical history, problem list, medications, allergies, family history and social history were all reviewed and documented as reviewed in the EPIC chart.  ROS: Pertinent positives and negatives as reviewed in HPI.   OBJECTIVE:  BP 126/80 (BP Location: Right Arm, Patient Position: Sitting, Cuff Size: Normal)   Ht 5\' 4"  (1.626 m)   Wt 232 lb (105.2 kg)    LMP 07/02/2020   BMI 39.82 kg/m  The patient appears well, alert, oriented x 3, in no distress. PELVIC EXAM: VULVA: normal appearing vulva with no masses, tenderness or lesions, VAGINA: normal appearing vagina with normal color and discharge, no lesions, CERVIX: Located far anterior, grossly normal appearing cervix without discharge or lesions, IUD strings are not seen or palpated, brushing the endocervix with the Cytobrush was unsuccessful in retrieving the strings, UTERUS: uterus is normal size, shape, consistency and nontender, ADNEXA: normal adnexa in size, nontender and no masses  Chaperone: 07/04/2020 (DNP student) present with me during examination  ASSESSMENT:  39 y.o. G2P2 here for evaluation of abnormal uterine bleeding, dysmenorrhea, and checkup on IUD  PLAN:  Potentially concerning for unnoticed IUD expulsion are the drastic change in bleeding pattern that has persisted and the IUD strings are not visible on exam today. We need to check a pelvic ultrasound which she will schedule. I noticed after the visit today that last year 11/2018 she had an abnormal Pap smear LGSIL and there has not yet been follow-up on that.  We should consider repeating the Pap smear. She indicated interest in hysterectomy for definitive treatment of abnormal uterine bleeding and dysmenorrhea, and has thought about this seriously in the past.  She has limited contraceptive options beyond just smoking, she indicates she and other family members have had sensitivities to use of estrogens in the past.  We discussed that she needs to stop smoking if planning to have surgery  and I gave her a prescription for nicotine patches to help her with that.  She has been successful in the past. We briefly discussed hysterectomy is a major surgical procedure and requires 6 to 8 weeks off of work.  I left her information on total laparoscopic hysterectomy considerations in her my chart so she can start to read about  it.   Theresia Majors MD 07/16/20

## 2020-07-16 NOTE — Patient Instructions (Signed)
Hysterectomy Information  A hysterectomy is a surgery in which the uterus is removed. The fallopian tubes and ovaries may be removed (bilateral salpingo-oophorectomy) as well. This procedure may be done to treat various medical problems. After the procedure, a woman will no longer have menstrual periods nor will she be able to become pregnant (sterile). What are the reasons for a hysterectomy? There are many reasons why a woman might have this procedure. They include:  Persistent, abnormal vaginal bleeding.  Long-term (chronic) pelvic pain or infection.  Endometriosis. This is when the lining of the uterus (endometrium) starts to grow outside the uterus.  Adenomyosis. This is when the endometrium starts to grow in the muscle of the uterus.  Pelvic organ prolapse. This is a condition in which the uterus falls down into the vagina.  Noncancerous growths in the uterus (uterine fibroids) that cause symptoms.  The presence of precancerous cells.  Cervical or uterine cancer. What are the different types of hysterectomy? There are three different types of hysterectomy:  Supracervical hysterectomy. In this type, the top part of the uterus is removed, but not the cervix.  Total hysterectomy. In this type, the uterus and cervix are removed.  Radical hysterectomy. In this type, the uterus, the cervix, and the tissue that holds the uterus in place (parametrium) are removed. What are the different ways a hysterectomy can be performed? There are many different ways a hysterectomy can be performed, including:  Abdominal hysterectomy. In this type, an incision is made in the abdomen. The uterus is removed through this incision.  Vaginal hysterectomy. In this type, an incision is made in the vagina. The uterus is removed through this incision. There are no abdominal incisions.  Conventional laparoscopic hysterectomy. In this type, three or four small incisions are made in the abdomen. A thin,  lighted tube with a camera (laparoscope) is inserted into one of the incisions. Other tools are put through the other incisions. The uterus is cut into small pieces. The small pieces are removed through the incisions or through the vagina.  Laparoscopically assisted vaginal hysterectomy (LAVH). In this type, three or four small incisions are made in the abdomen. Part of the surgery is performed laparoscopically and the other part is done vaginally. The uterus is removed through the vagina.  Robot-assisted laparoscopic hysterectomy. In this type, a laparoscope and other tools are inserted into three or four small incisions in the abdomen. A computer-controlled device is used to give the surgeon a 3D image and to help control the surgical instruments. This allows for more precise movements of surgical instruments. The uterus is cut into small pieces and removed through the incisions or removed through the vagina. Discuss the options with your health care provider to determine which type is the right one for you. What are the risks? Generally, this is a safe procedure. However, problems may occur, including:  Bleeding and risk of blood transfusion. Tell your health care provider if you do not want to receive any blood products.  Blood clots in the legs or lung.  Infection.  Damage to other structures or organs.  Allergic reactions to medicines.  Changing to an abdominal hysterectomy from one of the other techniques. What to expect after a hysterectomy  You will be given pain medicine.  You may need to stay in the hospital for 1- 2 days to recover, depending on the type of hysterectomy you had.  Follow your health care provider's instructions about exercise, driving, and general activities. Ask your   health care provider what activities are safe for you.  You will need to have someone with you for the first 3-5 days after you go home.  You will need to follow up with your surgeon in 2-4  weeks after surgery to evaluate your progress.  If the ovaries are removed, you will have early menopause symptoms such as hot flashes, night sweats, and insomnia.  If you had a hysterectomy for a problem that was not cancer or not a condition that could lead to cancer, then you no longer need Pap tests. However, even if you no longer need a Pap test, a regular pelvic exam is a good idea to make sure no other problems are developing. Questions to ask your health care provider  Is a hysterectomy medically necessary? Do I have other treatment options for my condition?  What are my options for hysterectomy procedure?  What organs and tissues need to be removed?  What are the risks?  What are the benefits?  How long will I need to stay in the hospital after the procedure?  How long will I need to recover at home?  What symptoms can I expect after the procedure? Summary  A hysterectomy is a surgery in which the uterus is removed. The fallopian tubes and ovaries may be removed (bilateral salpingo-oophorectomy) as well.  This procedure may be done to treat various medical problems. After the procedure, a woman will no longer have menstrual periods nor will she be able to become pregnant.  Discuss the options with your health care provider to determine which type of hysterectomy is the right one for you. This information is not intended to replace advice given to you by your health care provider. Make sure you discuss any questions you have with your health care provider. Document Revised: 10/07/2017 Document Reviewed: 12/01/2016 Elsevier Patient Education  2020 [employer]          Total Laparoscopic Hysterectomy A total laparoscopic hysterectomy is a minimally invasive surgery to remove the uterus and cervix. The fallopian tubes and ovaries can also be removed (bilateral salpingo-oophorectomy) during this surgery, if necessary. This procedure may be done to treat problems such  as:  Noncancerous growths in the uterus (uterine fibroids) that cause symptoms.  A condition that causes the lining of the uterus (endometrium) to grow in other areas (endometriosis).  Problems with pelvic support. This is caused by weakened muscles of the pelvis following vaginal childbirth or menopause.  Cancer of the cervix, ovaries, uterus, or endometrium.  Excessive (dysfunctional) uterine bleeding. This surgery is performed by inserting a thin, lighted tube (laparoscope) and surgical instruments into small incisions in the abdomen. The laparoscope sends images to a monitor. The images help the health care provider perform the procedure. After this procedure, you will no longer be able to have a baby, and you will no longer have a menstrual period. Tell a health care provider about:  Any allergies you have.  All medicines you are taking, including vitamins, herbs, eye drops, creams, and over-the-counter medicines.  Any problems you or family members have had with anesthetic medicines.  Any blood disorders you have.  Any surgeries you have had.  Any medical conditions you have.  Whether you are pregnant or may be pregnant. What are the risks? Generally, this is a safe procedure. However, problems may occur, including:  Infection.  Bleeding.  Blood clots in the legs or lungs.  Allergic reactions to medicines.  Damage to other structures or organs.  The risk that the surgery may have to be switched to the regular one in which a large incision is made in the abdomen (abdominal hysterectomy). What happens before the procedure? Staying hydrated Follow instructions from your health care provider about hydration, which may include:  Up to 2 hours before the procedure - you may continue to drink clear liquids, such as water, clear fruit juice, black coffee, and plain tea Eating and drinking restrictions Follow instructions from your health care provider about eating and  drinking, which may include:  8 hours before the procedure - stop eating heavy meals or foods such as meat, fried foods, or fatty foods.  6 hours before the procedure - stop eating light meals or foods, such as toast or cereal.  6 hours before the procedure - stop drinking milk or drinks that contain milk.  2 hours before the procedure - stop drinking clear liquids. Medicines  Ask your health care provider about: ? Changing or stopping your regular medicines. This is especially important if you are taking diabetes medicines or blood thinners. ? Taking over-the-counter medicines, vitamins, herbs, and supplements. ? Taking medicines such as aspirin and ibuprofen. These medicines can thin your blood. Do not take these medicines unless your health care provider tells you to take them.  You may be given antibiotic medicine to help prevent infection.  You may be asked to take laxatives.  You may be given medicines to help prevent nausea and vomiting after the procedure. General instructions  Ask your health care provider how your surgical site will be marked or identified.  You may be asked to shower with a germ-killing soap.  Do not use any products that contain nicotine or tobacco, such as cigarettes and e-cigarettes. If you need help quitting, ask your health care provider.  You may have an exam or testing, such as an ultrasound to determine the size and shape of your pelvic organs.  You may have a blood or urine sample taken.  This procedure can affect the way you feel about yourself. Talk with your health care provider about the physical and emotional changes hysterectomy may cause.  Plan to have someone take you home from the hospital or clinic.  Plan to have a responsible adult care for you for at least 24 hours after you leave the hospital or clinic. This is important. What happens during the procedure?  To lower your risk of infection: ? Your health care team will wash or  sanitize their hands. ? Your skin will be washed with soap. ? Hair may be removed from the surgical area.  An IV will be inserted into one of your veins.  You will be given one or more of the following: ? A medicine to help you relax (sedative). ? A medicine to make you fall asleep (general anesthetic).  You will be given antibiotic medicine through your IV.  A tube may be inserted down your throat to help you breathe during the procedure.  A gas (carbon dioxide) will be used to inflate your abdomen to allow your surgeon to see inside of your abdomen.  Three or four small incisions will be made in your abdomen.  A laparoscope will be inserted into one of your incisions. Surgical instruments will be inserted through the other incisions in order to perform the procedure.  Your uterus and cervix may be removed through your vagina or cut into small pieces and removed through the small incisions. Any other organs that need to  be removed will also be removed this way.  Carbon dioxide will be released from inside of your abdomen.  Your incisions will be closed with stitches (sutures).  A bandage (dressing) may be placed over your incisions. The procedure may vary among health care providers and hospitals. What happens after the procedure?  Your blood pressure, heart rate, breathing rate, and blood oxygen level will be monitored until the medicines you were given have worn off.  You will be given medicine for pain and nausea as needed.  Do not drive for 24 hours if you received a sedative. Summary  Total Laparoscopic hysterectomy is a procedure to remove your uterus, cervix and sometimes the fallopian tubes and ovaries.  This procedure can affect the way you feel about yourself. Talk with your health care provider about the physical and emotional changes hysterectomy may cause.  After this procedure, you will no longer be able to have a baby, and you will no longer have a menstrual  period.  You will be given pain medicine to control discomfort after this procedure. This information is not intended to replace advice given to you by your health care provider. Make sure you discuss any questions you have with your health care provider. Document Revised: 10/07/2017 Document Reviewed: 01/05/2017 Elsevier Patient Education  2020 [employer].

## 2020-07-17 ENCOUNTER — Other Ambulatory Visit: Payer: Self-pay | Admitting: Family Medicine

## 2020-07-17 DIAGNOSIS — Z975 Presence of (intrauterine) contraceptive device: Secondary | ICD-10-CM

## 2020-07-17 DIAGNOSIS — N921 Excessive and frequent menstruation with irregular cycle: Secondary | ICD-10-CM

## 2020-08-14 ENCOUNTER — Ambulatory Visit: Payer: No Typology Code available for payment source | Admitting: Obstetrics and Gynecology

## 2020-08-14 ENCOUNTER — Other Ambulatory Visit: Payer: No Typology Code available for payment source

## 2020-08-19 ENCOUNTER — Telehealth: Payer: Self-pay

## 2020-08-19 DIAGNOSIS — N921 Excessive and frequent menstruation with irregular cycle: Secondary | ICD-10-CM

## 2020-08-19 DIAGNOSIS — Z975 Presence of (intrauterine) contraceptive device: Secondary | ICD-10-CM

## 2020-08-19 MED ORDER — NORETHINDRONE 0.35 MG PO TABS: 1.0000 | ORAL_TABLET | Freq: Every day | ORAL | 0 refills | Status: DC

## 2020-08-19 NOTE — Telephone Encounter (Signed)
  Prescription Request  08/19/2020  What is the name of the medication or equipment? norethindrone (ORTHO MICRONOR) 0.35 MG tablet    Have you contacted your pharmacy to request a refill? (if applicable) YES  Which pharmacy would you like this sent to? [facility], pt has appt with OB GYN is on Thursday    Patient notified that their request is being sent to the clinical staff for review and that they should receive a response within 2 business days.

## 2020-08-19 NOTE — Telephone Encounter (Signed)
Rx filled for patient and pt aware.

## 2020-08-21 ENCOUNTER — Other Ambulatory Visit: Payer: No Typology Code available for payment source

## 2020-08-21 ENCOUNTER — Encounter: Payer: Self-pay | Admitting: Obstetrics and Gynecology

## 2020-08-21 ENCOUNTER — Ambulatory Visit (INDEPENDENT_AMBULATORY_CARE_PROVIDER_SITE_OTHER): Payer: No Typology Code available for payment source | Admitting: Obstetrics and Gynecology

## 2020-08-21 ENCOUNTER — Ambulatory Visit (INDEPENDENT_AMBULATORY_CARE_PROVIDER_SITE_OTHER): Payer: No Typology Code available for payment source

## 2020-08-21 ENCOUNTER — Other Ambulatory Visit: Payer: Self-pay

## 2020-08-21 VITALS — BP 122/76

## 2020-08-21 DIAGNOSIS — N939 Abnormal uterine and vaginal bleeding, unspecified: Secondary | ICD-10-CM

## 2020-08-21 DIAGNOSIS — T8332XA Displacement of intrauterine contraceptive device, initial encounter: Secondary | ICD-10-CM

## 2020-08-21 NOTE — Progress Notes (Signed)
   Caroline Hopkins 1981/09/19 [id number]  SUBJECTIVE:  39 y.o. G2P2 female presents for a pelvic ultrasound to evaluate the position of her IUD.  Please see the previous note from 07/16/2020 for details.  Her IUD strings were not visible on the last examination.   Allergies: Rocephin [ceftriaxone], Latex, Oxycodone, and Wellbutrin [bupropion]  No LMP recorded. (Menstrual status: IUD).  OBJECTIVE:  BP 122/76  Pelvic ultrasound Retroverted uterus 9.3 x 3.8 x 4.1 cm. Thin symmetrical endometrium 4.5 mm thickness. IUD appears to be in lower uterine segment, malpositioned. Bilateral ovaries are normal with normal follicle pattern. Right adnexa with 1.8 cm tubular cystic structure consistent with a hydrosalpinx. No other adnexal masses.  No free fluid.   ASSESSMENT:  39 y.o. G2P2 with malpositioned IUD  PLAN:  I did offer to remove the IUD today but she declined going through this procedure and exam today and as last time, she had indicated she wanted to proceed with definitive therapy with a hysterectomy.  I invited her to discuss this surgery more in detail today but she would like to return another day to do this, so she will make an appointment.  She has had a tubal ligation so reduced contraception effect of a malpositioned IUD should not be an issue.  No E&M charge for today's encounter.   Theresia Majors MD 08/21/20

## 2020-09-05 ENCOUNTER — Other Ambulatory Visit: Payer: Self-pay

## 2020-09-05 ENCOUNTER — Ambulatory Visit: Payer: No Typology Code available for payment source | Admitting: Obstetrics and Gynecology

## 2020-09-05 ENCOUNTER — Encounter: Payer: Self-pay | Admitting: Obstetrics and Gynecology

## 2020-09-05 VITALS — BP 130/80

## 2020-09-05 DIAGNOSIS — N946 Dysmenorrhea, unspecified: Secondary | ICD-10-CM

## 2020-09-05 DIAGNOSIS — N939 Abnormal uterine and vaginal bleeding, unspecified: Secondary | ICD-10-CM | POA: Diagnosis not present

## 2020-09-05 NOTE — Progress Notes (Signed)
Caroline Hopkins [birth year] [id number]  SUBJECTIVE:  39 y.o. G2P2 female presents for further discussion of a hysterectomy/preoperative visit.  She is still looking forward to proceeding with the surgery.  Please see the previous notes for details.  She has failed management with other methods including oral contraceptives and the malpositioned IUD, and due to her severe dysmenorrhea she would like to proceed with a hysterectomy.  She has had a tubal ligation.  2 prior cesarean deliveries.  She reports she has successfully quit smoking using the nicotine patches.  Even though she has quit smoking, she and many family members in the past have reported problems with using birth control containing estrogen so she is a is not comfortable with trying those methods.  Current Outpatient Medications  Medication Sig Dispense Refill  . Desvenlafaxine Succinate ER (PRISTIQ) 25 MG TB24 Take 50 mg by mouth daily. 60 tablet 2  . ibuprofen (ADVIL) 800 MG tablet TAKE ONE TABLET EVERY 8 HOURS AS NEEDED (Needs to be seen before next refill) 30 tablet 5  . nicotine (NICODERM CQ - DOSED IN MG/24 HOURS) 14 mg/24hr patch Place 1 patch (14 mg total) onto the skin daily. 28 patch 11  . norethindrone (ORTHO MICRONOR) 0.35 MG tablet Take 1 tablet (0.35 mg total) by mouth daily. 28 tablet 0  . omeprazole (PRILOSEC) 20 MG capsule TAKE ONE (1) CAPSULE EACH DAY 90 capsule 3   No current facility-administered medications for this visit.   Allergies: Rocephin [ceftriaxone], Latex, Oxycodone, and Wellbutrin [bupropion]  No LMP recorded. (Menstrual status: IUD).  Past medical history,surgical history, problem list, medications, allergies, family history and social history were all reviewed and documented as reviewed in the EPIC chart.  ROS: Pertinent positives and negatives as reviewed above in HPI   OBJECTIVE:  BP 130/80 (BP Location: Right Arm, Patient Position: Sitting, Cuff Size: Large)  The patient appears well, alert,  oriented, in no distress. PELVIC EXAM: Pruritus this was performed at recent visit   ASSESSMENT:  39 y.o. G2P2 here for preoperative discussion of hysterectomy for her dysmenorrhea and abnormal uterine bleeding refractory to conservative management, currently with malpositioned IUD  PLAN:  I propose a robotic assisted total laparoscopic hysterectomy with bilateral salpingectomy and cystoscopy.  IUD will just be removed en bloc with the uterus.  She has had time to review the information pamphlets on hysterectomy provided at the last encounter.  Today we discussed the hysterectomy procedure in detail with the patient including the length of time anticipated to perform the procedure, the possibility of same-day discharge versus overnight stay in the hospital and subsequent discharge the following day, and postoperative convalescence and recovery expectations.  I discussed that she will need to plan to be off of work for 6-8 weeks but may be able go back sooner a limited capacity keeping in mind lifting restrictions of 10 to 20 lb and no repetitive squatting, bending, or straining maneuvers.  Pelvic rest restriction was also reviewed.  The patient is aware that hysterectomy is considered to be a major surgical procedure.  There are risks of infection, bleeding, possibility of needing a blood transfusion, injury to bowel, bladder, major pelvic vessels, ureter, nerves from positioning and or skin irritation, and deep venous thrombosis. The risks of inadvertent injury to internal organs either immediately recognized or delay recognized necessitating major exploratory reparative surgeries and future reparative surgeries including bowel resection, ostomy formation, bladder repair, ureteral damage repair were all discussed with her.  General anesthesia also has risks  including myocardial infarction, stroke, and death.  Common scenarios for complications from surgery were reviewed with the patient including focus on  bladder and/or ureteral injuries.  Incisional complications to include opening and draining of incisions and closure by secondary intention, dehiscence, and hernia formation were reviewed. Generally the complication rate is less than 1%.  Conversion to laparotomy may be deemed necessary at the time of the procedure, which if performed would result in longer hospital stay, and more postoperative pain and healing time.  She agrees with all the above and would like to proceed.  All questions were answered.  I will have office staff contact her to arrange the procedure.   Theresia Majors MD 09/05/20

## 2020-09-08 ENCOUNTER — Telehealth: Payer: Self-pay

## 2020-09-08 NOTE — Telephone Encounter (Signed)
I spoke with patient about scheduling surgery. She would like to have Robotic TLH, BSE asap but I do not have time available.  I did tell her I will watch for cancellation, release of block times and see what I can figure out. Meantime, I cannot schedule it right now.  I discussed her insurance benefits with her and her estimated surgery prepayment due by one week prior to surgery.    I discussed with her need for COvid test 4 days prior to surgery and the quarantine protocol for after having tested.  I will call her as soon as I can secure a date for her surgery.

## 2020-09-10 ENCOUNTER — Encounter: Payer: Self-pay | Admitting: Gynecology

## 2020-09-22 NOTE — Progress Notes (Addendum)
COVID Vaccine Completed:  x2 Date COVID Vaccine completed:   11-05-19 & 11-26-19 [facility] COVID vaccine manufacturer: Pfizer    Moderna   [employer]   PCP - Arville Care, MD Cardiologist - N/A  Chest x-ray -  EKG -  Stress Test -  ECHO -  Cardiac Cath -  Pacemaker/ICD device last checked:  Sleep Study -  CPAP -   Fasting Blood Sugar -  Checks Blood Sugar _____ times a day  Blood Thinner Instructions: Aspirin Instructions: Last Dose:  Anesthesia review:   Patient denies shortness of breath, fever, cough and chest pain at PAT appointment.  Pt able to climb a flight of stairs and complete ADLs without assistance.   Patient verbalized understanding of instructions that were given to them at the PAT appointment. Patient was also instructed that they will need to review over the PAT instructions again at home before surgery.

## 2020-09-22 NOTE — Patient Instructions (Addendum)
DUE TO COVID-19 ONLY ONE VISITOR IS ALLOWED IN WAITING ROOM (VISITOR WILL HAVE A TEMPERATURE CHECK ON ARRIVAL AND MUST WEAR A FACE MASK THE ENTIRE TIME.)  ONCE YOU ARE ADMITTED TO YOUR PRIVATE ROOM, THE SAME ONE VISITOR IS ALLOWED TO VISIT DURING VISITING HOURS ONLY.  Your COVID swab testing is scheduled for Thursday, 09-25-20 @ 9:55 AM,   You must self quarantine after your testing per handout given to you at the testing site. [address] [place], [place] [address]  (Must self quarantine after testing. Follow instructions on handout.)       Your procedure is scheduled on:  Monday, 09-29-20  Report to [facility] AT 7:15 A. M.   Call this number if you have problems the morning of surgery:[phone].   OUR ADDRESS IS [address].  WE ARE LOCATED IN [facility].                                     REMEMBER:  DO NOT EAT FOOD AFTER MIDNIGHT .  YOU MAY HAVE CLEAR LIQUIDS FROM MIDNIGHT UNTIL 6:00 AM  CLEAR LIQUID DIET  Foods Allowed                                                                     Foods Excluded  Water, Black Coffee and tea, regular and decaf              liquids that you cannot  Plain Jell-O in any flavor  (No red)                                     see through such as: Fruit ices (not with fruit pulp)                                      milk, soups, orange juice  Iced Popsicles (No red)                                     All solid food                                   Apple juices Sports drinks like Gatorade (No red) Lightly seasoned clear broth or consume(fat free) Sugar, honey syrup  BRUSH YOUR TEETH THE MORNING OF SURGERY.  TAKE THESE MEDICATIONS MORNING OF SURGERY WITH A SIP OF WATER:  Omeprazole  DO NOT WEAR JEWERLY, MAKE UP, OR NAIL POLISH.  DO NOT WEAR LOTIONS, POWDERS, PERFUMES/COLOGNE OR DEODORANT.  DO NOT SHAVE FOR 24 HOURS PRIOR TO DAY OF SURGERY.  CONTACTS, GLASSES, OR DENTURES MAY NOT BE WORN TO  SURGERY.  [facility] IS NOT RESPONSIBLE  FOR ANY BELONGINGS.                    WHAT IS A BLOOD TRANSFUSION? Blood Transfusion Information  A transfusion is the replacement of blood or some of its parts. Blood is made up of multiple cells which provide different functions.  Red blood cells carry oxygen and are used for blood loss replacement.  White blood cells fight against infection.  Platelets control bleeding.  Plasma helps clot blood.  Other blood products are available for specialized needs, such as hemophilia or other clotting disorders. BEFORE THE TRANSFUSION  Who gives blood for transfusions?   Healthy volunteers who are fully evaluated to make sure their blood is safe. This is blood bank blood. Transfusion therapy is the safest it has ever been in the practice of medicine. Before blood is taken from a donor, a complete history is taken to make sure that person has no history of diseases nor engages in risky social behavior (examples are intravenous drug use or sexual activity with multiple partners). The donor's travel history is screened to minimize risk of transmitting infections, such as malaria. The donated blood is tested for signs of infectious diseases, such as HIV and hepatitis. The blood is then tested to be sure it is compatible with you in order to minimize the chance of a transfusion reaction. If you or a relative donates blood, this is often done in anticipation of surgery and is not appropriate for emergency situations. It takes many days to process the donated blood. RISKS AND COMPLICATIONS Although transfusion therapy is very safe and saves many lives, the main dangers of transfusion include:   Getting an infectious disease.  Developing a transfusion reaction. This is an allergic reaction to something in the blood you were given. Every precaution is taken to prevent this. The decision to have a blood transfusion has been  considered carefully by your caregiver before blood is given. Blood is not given unless the benefits outweigh the risks. AFTER THE TRANSFUSION  Right after receiving a blood transfusion, you will usually feel much better and more energetic. This is especially true if your red blood cells have gotten low (anemic). The transfusion raises the level of the red blood cells which carry oxygen, and this usually causes an energy increase.  The nurse administering the transfusion will monitor you carefully for complications. HOME CARE INSTRUCTIONS  No special instructions are needed after a transfusion. You may find your energy is better. Speak with your caregiver about any limitations on activity for underlying diseases you may have. SEEK MEDICAL CARE IF:   Your condition is not improving after your transfusion.  You develop redness or irritation at the intravenous (IV) site. SEEK IMMEDIATE MEDICAL CARE IF:  Any of the following symptoms occur over the next 12 hours:  Shaking chills.  You have a temperature by mouth above 102 F (38.9 C), not controlled by medicine.  Chest, back, or muscle pain.  People around you feel you are not acting correctly or are confused.  Shortness of breath or difficulty breathing.  Dizziness and fainting.  You get a rash or develop hives.  You have a decrease in urine output.  Your urine turns a dark color or changes to pink, red, or brown. Any of the following symptoms occur over the next 10 days:  You have a temperature by mouth above 102 F (38.9 C), not controlled by medicine.  Shortness of breath.  Weakness after normal activity.  The white part of the eye turns yellow (jaundice).  You have a decrease in the amount of urine or are urinating less often.  Your urine turns a dark color or changes to pink, red, or brown. Document Released: 10/22/2000 Document Revised: 01/17/2012 Document Reviewed: 06/10/2008 [facility] Patient Information 2014  [place], [place].  _______________________________________________________________________

## 2020-09-23 ENCOUNTER — Other Ambulatory Visit: Payer: Self-pay | Admitting: Family Medicine

## 2020-09-23 ENCOUNTER — Encounter: Payer: Self-pay | Admitting: Gynecology

## 2020-09-23 DIAGNOSIS — F5104 Psychophysiologic insomnia: Secondary | ICD-10-CM

## 2020-09-23 DIAGNOSIS — Z0289 Encounter for other administrative examinations: Secondary | ICD-10-CM

## 2020-09-23 DIAGNOSIS — F339 Major depressive disorder, recurrent, unspecified: Secondary | ICD-10-CM

## 2020-09-25 ENCOUNTER — Encounter (HOSPITAL_COMMUNITY)
Admission: RE | Admit: 2020-09-25 | Discharge: 2020-09-25 | Disposition: A | Discharge status: Home / Self Care | Payer: No Typology Code available for payment source | Source: Ambulatory Visit | Attending: Obstetrics and Gynecology | Admitting: Obstetrics and Gynecology

## 2020-09-25 ENCOUNTER — Other Ambulatory Visit (HOSPITAL_COMMUNITY)
Admission: RE | Admit: 2020-09-25 | Discharge: 2020-09-25 | Disposition: A | Discharge status: Home / Self Care | Payer: No Typology Code available for payment source | Source: Ambulatory Visit | Attending: Obstetrics and Gynecology | Admitting: Obstetrics and Gynecology

## 2020-09-25 ENCOUNTER — Other Ambulatory Visit: Payer: Self-pay

## 2020-09-25 ENCOUNTER — Encounter (HOSPITAL_COMMUNITY): Payer: Self-pay

## 2020-09-25 DIAGNOSIS — Z20822 Contact with and (suspected) exposure to covid-19: Secondary | ICD-10-CM | POA: Insufficient documentation

## 2020-09-25 DIAGNOSIS — Z01812 Encounter for preprocedural laboratory examination: Secondary | ICD-10-CM | POA: Insufficient documentation

## 2020-09-25 HISTORY — DX: Headache, unspecified: R51.9

## 2020-09-25 HISTORY — DX: Personal history of urinary calculi: Z87.442

## 2020-09-25 HISTORY — DX: Compression of brain: G93.5

## 2020-09-25 LAB — CBC
HCT: 42.1 % (ref 36.0–46.0)
Hemoglobin: 13.8 g/dL (ref 12.0–15.0)
MCH: 31 pg (ref 26.0–34.0)
MCHC: 32.8 g/dL (ref 30.0–36.0)
MCV: 94.6 fL (ref 80.0–100.0)
Platelets: 258 10*3/uL (ref 150–400)
RBC: 4.45 MIL/uL (ref 3.87–5.11)
RDW: 12.7 % (ref 11.5–15.5)
WBC: 7.8 10*3/uL (ref 4.0–10.5)
nRBC: 0 % (ref 0.0–0.2)

## 2020-09-25 LAB — SARS CORONAVIRUS 2 (TAT 6-24 HRS): SARS Coronavirus 2: NEGATIVE

## 2020-09-29 ENCOUNTER — Ambulatory Visit (HOSPITAL_BASED_OUTPATIENT_CLINIC_OR_DEPARTMENT_OTHER): Payer: No Typology Code available for payment source | Admitting: Anesthesiology

## 2020-09-29 ENCOUNTER — Ambulatory Visit (HOSPITAL_BASED_OUTPATIENT_CLINIC_OR_DEPARTMENT_OTHER)
Admission: RE | Admit: 2020-09-29 | Discharge: 2020-09-30 | Disposition: A | Discharge status: Home / Self Care | Payer: No Typology Code available for payment source | Attending: Obstetrics and Gynecology | Admitting: Obstetrics and Gynecology

## 2020-09-29 ENCOUNTER — Encounter (HOSPITAL_BASED_OUTPATIENT_CLINIC_OR_DEPARTMENT_OTHER): Payer: Self-pay | Admitting: Obstetrics and Gynecology

## 2020-09-29 ENCOUNTER — Other Ambulatory Visit: Payer: Self-pay

## 2020-09-29 ENCOUNTER — Encounter (HOSPITAL_BASED_OUTPATIENT_CLINIC_OR_DEPARTMENT_OTHER)
Admission: RE | Disposition: A | Discharge status: Home / Self Care | Payer: Self-pay | Source: Home / Self Care | Attending: Obstetrics and Gynecology

## 2020-09-29 DIAGNOSIS — Z79899 Other long term (current) drug therapy: Secondary | ICD-10-CM | POA: Insufficient documentation

## 2020-09-29 DIAGNOSIS — N840 Polyp of corpus uteri: Secondary | ICD-10-CM | POA: Diagnosis not present

## 2020-09-29 DIAGNOSIS — Z885 Allergy status to narcotic agent status: Secondary | ICD-10-CM | POA: Insufficient documentation

## 2020-09-29 DIAGNOSIS — N939 Abnormal uterine and vaginal bleeding, unspecified: Secondary | ICD-10-CM | POA: Insufficient documentation

## 2020-09-29 DIAGNOSIS — N856 Intrauterine synechiae: Secondary | ICD-10-CM | POA: Insufficient documentation

## 2020-09-29 DIAGNOSIS — N800 Endometriosis of uterus: Secondary | ICD-10-CM

## 2020-09-29 DIAGNOSIS — N838 Other noninflammatory disorders of ovary, fallopian tube and broad ligament: Secondary | ICD-10-CM | POA: Insufficient documentation

## 2020-09-29 DIAGNOSIS — N736 Female pelvic peritoneal adhesions (postinfective): Secondary | ICD-10-CM | POA: Insufficient documentation

## 2020-09-29 DIAGNOSIS — T8332XA Displacement of intrauterine contraceptive device, initial encounter: Secondary | ICD-10-CM | POA: Diagnosis not present

## 2020-09-29 DIAGNOSIS — N946 Dysmenorrhea, unspecified: Secondary | ICD-10-CM | POA: Diagnosis not present

## 2020-09-29 DIAGNOSIS — Z91040 Latex allergy status: Secondary | ICD-10-CM | POA: Diagnosis not present

## 2020-09-29 DIAGNOSIS — Z87891 Personal history of nicotine dependence: Secondary | ICD-10-CM | POA: Diagnosis not present

## 2020-09-29 DIAGNOSIS — Y762 Prosthetic and other implants, materials and accessory obstetric and gynecological devices associated with adverse incidents: Secondary | ICD-10-CM | POA: Diagnosis not present

## 2020-09-29 DIAGNOSIS — Z881 Allergy status to other antibiotic agents status: Secondary | ICD-10-CM | POA: Diagnosis not present

## 2020-09-29 HISTORY — PX: ROBOTIC ASSISTED LAPAROSCOPIC HYSTERECTOMY AND SALPINGECTOMY: SHX6379

## 2020-09-29 LAB — TYPE AND SCREEN
ABO/RH(D): B POS
Antibody Screen: NEGATIVE

## 2020-09-29 LAB — POCT PREGNANCY, URINE: Preg Test, Ur: NEGATIVE

## 2020-09-29 LAB — HEMOGLOBIN: Hemoglobin: 13.6 g/dL (ref 12.0–15.0)

## 2020-09-29 SURGERY — XI ROBOTIC ASSISTED LAPAROSCOPIC HYSTERECTOMY AND SALPINGECTOMY
Anesthesia: General

## 2020-09-29 MED ORDER — ONDANSETRON HCL 4 MG PO TABS: 4.0000 mg | ORAL_TABLET | Freq: Four times a day (QID) | ORAL | Status: DC | PRN

## 2020-09-29 MED ORDER — HYDROCODONE-ACETAMINOPHEN 5-325 MG PO TABS
1.0000 | ORAL_TABLET | Freq: Four times a day (QID) | ORAL | 0 refills | Status: DC | PRN
Start: 2020-09-29 — End: 2020-10-15

## 2020-09-29 MED ORDER — HYDROCODONE-ACETAMINOPHEN 5-325 MG PO TABS
1.0000 | ORAL_TABLET | ORAL | Status: DC | PRN
  Administered 2020-09-29 – 2020-09-30 (×2): 2 via ORAL

## 2020-09-29 MED ORDER — EPHEDRINE SULFATE 50 MG/ML IJ SOLN
INTRAMUSCULAR | Status: DC | PRN
  Administered 2020-09-29: 5 mg via INTRAVENOUS
  Administered 2020-09-29: 10 mg via INTRAVENOUS

## 2020-09-29 MED ORDER — ACETAMINOPHEN 500 MG PO TABS: 500.0000 mg | ORAL_TABLET | Freq: Four times a day (QID) | ORAL | Status: AC

## 2020-09-29 MED ORDER — FENTANYL CITRATE (PF) 100 MCG/2ML IJ SOLN
INTRAMUSCULAR | Status: AC
  Filled 2020-09-29: qty 2

## 2020-09-29 MED ORDER — IBUPROFEN 800 MG PO TABS
ORAL_TABLET | ORAL | Status: AC
  Filled 2020-09-29: qty 1

## 2020-09-29 MED ORDER — KETAMINE HCL 10 MG/ML IJ SOLN
INTRAMUSCULAR | Status: DC | PRN
  Administered 2020-09-29: 10 mg via INTRAVENOUS
  Administered 2020-09-29: 30 mg via INTRAVENOUS
  Administered 2020-09-29 (×2): 10 mg via INTRAVENOUS

## 2020-09-29 MED ORDER — BUPIVACAINE HCL (PF) 0.25 % IJ SOLN
INTRAMUSCULAR | Status: DC | PRN
  Administered 2020-09-29: 15 mL

## 2020-09-29 MED ORDER — ROCURONIUM BROMIDE 10 MG/ML (PF) SYRINGE
PREFILLED_SYRINGE | INTRAVENOUS | Status: AC
  Filled 2020-09-29: qty 10

## 2020-09-29 MED ORDER — DEXAMETHASONE SODIUM PHOSPHATE 10 MG/ML IJ SOLN
INTRAMUSCULAR | Status: DC | PRN
  Administered 2020-09-29: 10 mg via INTRAVENOUS

## 2020-09-29 MED ORDER — FENTANYL CITRATE (PF) 100 MCG/2ML IJ SOLN
INTRAMUSCULAR | Status: DC | PRN
  Administered 2020-09-29: 50 ug via INTRAVENOUS
  Administered 2020-09-29: 100 ug via INTRAVENOUS
  Administered 2020-09-29 (×3): 50 ug via INTRAVENOUS

## 2020-09-29 MED ORDER — LIDOCAINE 2% (20 MG/ML) 5 ML SYRINGE: INTRAMUSCULAR | Status: DC | PRN

## 2020-09-29 MED ORDER — CHLORHEXIDINE GLUCONATE 0.12 % MT SOLN: 15.0000 mL | Freq: Once | OROMUCOSAL | Status: DC

## 2020-09-29 MED ORDER — LIDOCAINE HCL (PF) 2 % IJ SOLN
INTRAMUSCULAR | Status: AC
  Filled 2020-09-29: qty 5

## 2020-09-29 MED ORDER — PROPOFOL 10 MG/ML IV BOLUS
INTRAVENOUS | Status: DC | PRN
  Administered 2020-09-29: 150 mg via INTRAVENOUS

## 2020-09-29 MED ORDER — LIDOCAINE 2% (20 MG/ML) 5 ML SYRINGE
INTRAMUSCULAR | Status: DC | PRN
  Administered 2020-09-29: 1.5 mg/kg/h via INTRAVENOUS

## 2020-09-29 MED ORDER — ACETAMINOPHEN 500 MG PO TABS
ORAL_TABLET | ORAL | Status: AC
  Filled 2020-09-29: qty 2

## 2020-09-29 MED ORDER — FENTANYL CITRATE (PF) 250 MCG/5ML IJ SOLN
INTRAMUSCULAR | Status: AC
  Filled 2020-09-29: qty 5

## 2020-09-29 MED ORDER — DOCUSATE SODIUM 100 MG PO CAPS
100.0000 mg | ORAL_CAPSULE | Freq: Two times a day (BID) | ORAL | Status: DC
  Administered 2020-09-29: 100 mg via ORAL

## 2020-09-29 MED ORDER — KETOROLAC TROMETHAMINE 30 MG/ML IJ SOLN
INTRAMUSCULAR | Status: DC | PRN
  Administered 2020-09-29: 30 mg via INTRAVENOUS

## 2020-09-29 MED ORDER — EPHEDRINE SULFATE-NACL 50-0.9 MG/10ML-% IV SOSY
PREFILLED_SYRINGE | INTRAVENOUS | Status: DC | PRN
  Administered 2020-09-29: 10 mg via INTRAVENOUS
  Administered 2020-09-29 (×2): 5 mg via INTRAVENOUS

## 2020-09-29 MED ORDER — PROPOFOL 10 MG/ML IV BOLUS
INTRAVENOUS | Status: AC
  Filled 2020-09-29: qty 20

## 2020-09-29 MED ORDER — SIMETHICONE 80 MG PO CHEW
80.0000 mg | CHEWABLE_TABLET | Freq: Four times a day (QID) | ORAL | Status: DC | PRN
  Administered 2020-09-29: 80 mg via ORAL

## 2020-09-29 MED ORDER — DEXAMETHASONE SODIUM PHOSPHATE 10 MG/ML IJ SOLN
INTRAMUSCULAR | Status: AC
  Filled 2020-09-29: qty 1

## 2020-09-29 MED ORDER — LACTATED RINGERS IV SOLN
INTRAVENOUS | Status: DC
  Administered 2020-09-29: 125 mL/h via INTRAVENOUS

## 2020-09-29 MED ORDER — KETAMINE HCL 10 MG/ML IJ SOLN
INTRAMUSCULAR | Status: AC
  Filled 2020-09-29: qty 1

## 2020-09-29 MED ORDER — LACTATED RINGERS IV SOLN: INTRAVENOUS | Status: DC

## 2020-09-29 MED ORDER — DOCUSATE SODIUM 100 MG PO CAPS: 100.0000 mg | ORAL_CAPSULE | Freq: Two times a day (BID) | ORAL | Status: DC | PRN

## 2020-09-29 MED ORDER — HYDROCODONE-ACETAMINOPHEN 5-325 MG PO TABS
ORAL_TABLET | ORAL | Status: AC
  Filled 2020-09-29: qty 2

## 2020-09-29 MED ORDER — IBUPROFEN 800 MG PO TABS
800.0000 mg | ORAL_TABLET | Freq: Three times a day (TID) | ORAL | Status: DC
  Administered 2020-09-29 – 2020-09-30 (×2): 800 mg via ORAL

## 2020-09-29 MED ORDER — ONDANSETRON HCL 4 MG/2ML IJ SOLN
INTRAMUSCULAR | Status: DC | PRN
  Administered 2020-09-29: 4 mg via INTRAVENOUS

## 2020-09-29 MED ORDER — POVIDONE-IODINE 10 % EX SWAB
2.0000 "application " | Freq: Once | CUTANEOUS | Status: AC
  Administered 2020-09-29: 2 via TOPICAL

## 2020-09-29 MED ORDER — CLINDAMYCIN PHOSPHATE 900 MG/50ML IV SOLN
INTRAVENOUS | Status: AC
  Filled 2020-09-29: qty 50

## 2020-09-29 MED ORDER — WHITE PETROLATUM EX OINT
TOPICAL_OINTMENT | CUTANEOUS | Status: AC
  Filled 2020-09-29: qty 5

## 2020-09-29 MED ORDER — ACETAMINOPHEN 500 MG PO TABS
1000.0000 mg | ORAL_TABLET | Freq: Four times a day (QID) | ORAL | Status: DC
  Administered 2020-09-29 – 2020-09-30 (×2): 1000 mg via ORAL

## 2020-09-29 MED ORDER — FENTANYL CITRATE (PF) 100 MCG/2ML IJ SOLN: 25.0000 ug | INTRAMUSCULAR | Status: DC | PRN

## 2020-09-29 MED ORDER — DOCUSATE SODIUM 100 MG PO CAPS
ORAL_CAPSULE | ORAL | Status: AC
  Filled 2020-09-29: qty 1

## 2020-09-29 MED ORDER — ONDANSETRON HCL 4 MG/2ML IJ SOLN
INTRAMUSCULAR | Status: AC
  Filled 2020-09-29: qty 2

## 2020-09-29 MED ORDER — KETOROLAC TROMETHAMINE 30 MG/ML IJ SOLN
INTRAMUSCULAR | Status: AC
  Filled 2020-09-29: qty 1

## 2020-09-29 MED ORDER — LACTATED RINGERS IV SOLN
INTRAVENOUS | Status: DC
  Administered 2020-09-29: 125 mL via INTRAVENOUS

## 2020-09-29 MED ORDER — MIDAZOLAM HCL 5 MG/5ML IJ SOLN
INTRAMUSCULAR | Status: DC | PRN
  Administered 2020-09-29: 2 mg via INTRAVENOUS

## 2020-09-29 MED ORDER — ALUM & MAG HYDROXIDE-SIMETH 200-200-20 MG/5ML PO SUSP: 30.0000 mL | ORAL | Status: DC | PRN

## 2020-09-29 MED ORDER — ACETAMINOPHEN 500 MG PO TABS
1000.0000 mg | ORAL_TABLET | Freq: Once | ORAL | Status: AC
  Administered 2020-09-29: 1000 mg via ORAL

## 2020-09-29 MED ORDER — SIMETHICONE 80 MG PO CHEW
CHEWABLE_TABLET | ORAL | Status: AC
  Filled 2020-09-29: qty 1

## 2020-09-29 MED ORDER — ROCURONIUM BROMIDE 10 MG/ML (PF) SYRINGE
PREFILLED_SYRINGE | INTRAVENOUS | Status: DC | PRN
  Administered 2020-09-29: 20 mg via INTRAVENOUS
  Administered 2020-09-29: 30 mg via INTRAVENOUS
  Administered 2020-09-29: 10 mg via INTRAVENOUS
  Administered 2020-09-29: 100 mg via INTRAVENOUS
  Administered 2020-09-29: 10 mg via INTRAVENOUS

## 2020-09-29 MED ORDER — LIDOCAINE HCL 2 % IJ SOLN
INTRAMUSCULAR | Status: AC
  Filled 2020-09-29: qty 20

## 2020-09-29 MED ORDER — SUGAMMADEX SODIUM 200 MG/2ML IV SOLN
INTRAVENOUS | Status: DC | PRN
  Administered 2020-09-29: 200 mg via INTRAVENOUS

## 2020-09-29 MED ORDER — EPHEDRINE 5 MG/ML INJ
INTRAVENOUS | Status: AC
  Filled 2020-09-29: qty 10

## 2020-09-29 MED ORDER — GENTAMICIN SULFATE 40 MG/ML IJ SOLN
380.0000 mg | INTRAVENOUS | Status: AC
  Administered 2020-09-29: 380 mg via INTRAVENOUS
  Filled 2020-09-29: qty 2

## 2020-09-29 MED ORDER — ONDANSETRON HCL 4 MG/2ML IJ SOLN
4.0000 mg | Freq: Four times a day (QID) | INTRAMUSCULAR | Status: DC | PRN
  Administered 2020-09-29: 4 mg via INTRAVENOUS

## 2020-09-29 MED ORDER — ORAL CARE MOUTH RINSE: 15.0000 mL | Freq: Once | OROMUCOSAL | Status: DC

## 2020-09-29 MED ORDER — CLINDAMYCIN PHOSPHATE 900 MG/50ML IV SOLN
900.0000 mg | INTRAVENOUS | Status: AC
  Administered 2020-09-29: 900 mg via INTRAVENOUS

## 2020-09-29 MED ORDER — MIDAZOLAM HCL 2 MG/2ML IJ SOLN
INTRAMUSCULAR | Status: AC
  Filled 2020-09-29: qty 2

## 2020-09-29 MED ORDER — IBUPROFEN 200 MG PO TABS: 600.0000 mg | ORAL_TABLET | Freq: Four times a day (QID) | ORAL | Status: AC

## 2020-09-29 SURGICAL SUPPLY — 68 items
ADH SKN CLS APL DERMABOND .7 (GAUZE/BANDAGES/DRESSINGS) ×2
BARRIER ADHS 3X4 INTERCEED (GAUZE/BANDAGES/DRESSINGS) IMPLANT
BRR ADH 4X3 ABS CNTRL BYND (GAUZE/BANDAGES/DRESSINGS)
COVER BACK TABLE 60X90IN (DRAPES) ×3 IMPLANT
COVER SURGICAL LIGHT HANDLE (MISCELLANEOUS) ×3 IMPLANT
COVER TIP SHEARS 8 DVNC (MISCELLANEOUS) ×2 IMPLANT
COVER TIP SHEARS 8MM DA VINCI (MISCELLANEOUS) ×3
COVER WAND RF STERILE (DRAPES) ×3 IMPLANT
DECANTER SPIKE VIAL GLASS SM (MISCELLANEOUS) ×3 IMPLANT
DEFOGGER SCOPE WARMER CLEARIFY (MISCELLANEOUS) ×3 IMPLANT
DERMABOND ADVANCED (GAUZE/BANDAGES/DRESSINGS) ×1
DERMABOND ADVANCED .7 DNX12 (GAUZE/BANDAGES/DRESSINGS) ×2 IMPLANT
DRAPE ARM DVNC X/XI (DISPOSABLE) ×8 IMPLANT
DRAPE COLUMN DVNC XI (DISPOSABLE) ×2 IMPLANT
DRAPE DA VINCI XI ARM (DISPOSABLE) ×4
DRAPE DA VINCI XI COLUMN (DISPOSABLE) ×1
DRAPE UTILITY XL STRL (DRAPES) ×3 IMPLANT
DURAPREP 26ML APPLICATOR (WOUND CARE) ×3 IMPLANT
ELECT REM PT RETURN 9FT ADLT (ELECTROSURGICAL) ×3
ELECTRODE REM PT RTRN 9FT ADLT (ELECTROSURGICAL) ×2 IMPLANT
GAUZE PETROLATUM 1 X8 (GAUZE/BANDAGES/DRESSINGS) ×3 IMPLANT
GAUZE XEROFORM 5X9 LF (GAUZE/BANDAGES/DRESSINGS) ×3 IMPLANT
GLOVE BIOGEL PI IND STRL 6.5 (GLOVE) ×6 IMPLANT
GLOVE BIOGEL PI IND STRL 7.0 (GLOVE) ×6 IMPLANT
GLOVE BIOGEL PI IND STRL 7.5 (GLOVE) ×16 IMPLANT
GLOVE BIOGEL PI IND STRL 8 (GLOVE) ×8 IMPLANT
GLOVE BIOGEL PI INDICATOR 6.5 (GLOVE) ×3
GLOVE BIOGEL PI INDICATOR 7.0 (GLOVE) ×3
GLOVE BIOGEL PI INDICATOR 7.5 (GLOVE) ×8
GLOVE BIOGEL PI INDICATOR 8 (GLOVE) ×4
GLOVE SURG SS PI 6.5 STRL IVOR (GLOVE) ×6 IMPLANT
GLOVE SURG SS PI 7.5 STRL IVOR (GLOVE) ×6 IMPLANT
GLOVE SURG SS PI 8.0 STRL IVOR (GLOVE) ×12 IMPLANT
GOWN STRL REUS W/TWL LRG LVL3 (GOWN DISPOSABLE) ×24 IMPLANT
GOWN STRL REUS W/TWL XL LVL3 (GOWN DISPOSABLE) ×9 IMPLANT
HOLDER FOLEY CATH W/STRAP (MISCELLANEOUS) ×3 IMPLANT
IRRIG SUCT STRYKERFLOW 2 WTIP (MISCELLANEOUS) ×3
IRRIGATION SUCT STRKRFLW 2 WTP (MISCELLANEOUS) ×2 IMPLANT
IV NS IRRIG 3000ML ARTHROMATIC (IV SOLUTION) ×3 IMPLANT
KIT TURNOVER CYSTO (KITS) ×3 IMPLANT
LEGGING LITHOTOMY PAIR STRL (DRAPES) ×3 IMPLANT
MANIFOLD NEPTUNE II (INSTRUMENTS) ×3 IMPLANT
MANIPULATOR VCARE STD CRV RETR (MISCELLANEOUS) IMPLANT
NS IRRIG 500ML POUR BTL (IV SOLUTION) ×3 IMPLANT
OBTURATOR OPTICAL STANDARD 8MM (TROCAR) ×2
OBTURATOR OPTICAL STND 8 DVNC (TROCAR) ×2
OBTURATOR OPTICALSTD 8 DVNC (TROCAR) ×2 IMPLANT
OCCLUDER COLPOPNEUMO (BALLOONS) ×3 IMPLANT
PACK ROBOT WH (CUSTOM PROCEDURE TRAY) ×3 IMPLANT
PACK TRENDGUARD 450 HYBRID PRO (MISCELLANEOUS) ×2 IMPLANT
PAD OB MATERNITY 4.3X12.25 (PERSONAL CARE ITEMS) ×3 IMPLANT
PAD PREP 24X48 CUFFED NSTRL (MISCELLANEOUS) ×3 IMPLANT
PROTECTOR NERVE ULNAR (MISCELLANEOUS) ×6 IMPLANT
SEAL CANN UNIV 5-8 DVNC XI (MISCELLANEOUS) ×8 IMPLANT
SEAL XI 5MM-8MM UNIVERSAL (MISCELLANEOUS) ×4
SET IRRIG Y TYPE TUR BLADDER L (SET/KITS/TRAYS/PACK) ×3 IMPLANT
SET TRI-LUMEN FLTR TB AIRSEAL (TUBING) ×3 IMPLANT
SUT VIC AB 0 CT1 27 (SUTURE) ×3
SUT VIC AB 0 CT1 27XBRD ANBCTR (SUTURE) ×2 IMPLANT
SUT VICRYL 0 UR6 27IN ABS (SUTURE) ×9 IMPLANT
SUT VLOC 180 0 9IN  GS21 (SUTURE) ×2
SUT VLOC 180 0 9IN GS21 (SUTURE) ×4 IMPLANT
SYR 50ML LL SCALE MARK (SYRINGE) ×6 IMPLANT
TIP UTERINE 6.7X8CM BLUE DISP (MISCELLANEOUS) ×3 IMPLANT
TOWEL OR 17X26 10 PK STRL BLUE (TOWEL DISPOSABLE) ×3 IMPLANT
TRAY FOLEY W/BAG SLVR 14FR LF (SET/KITS/TRAYS/PACK) ×3 IMPLANT
TRENDGUARD 450 HYBRID PRO PACK (MISCELLANEOUS) ×3
TROCAR PORT AIRSEAL 5X120 (TROCAR) ×3 IMPLANT

## 2020-09-29 NOTE — Anesthesia Procedure Notes (Signed)
Procedure Name: Intubation Date/Time: 09/29/2020 9:33 AM Performed by: Rogers Blocker, CRNA Pre-anesthesia Checklist: Patient identified, Emergency Drugs available, Suction available and Patient being monitored Patient Re-evaluated:Patient Re-evaluated prior to induction Oxygen Delivery Method: Circle System Utilized Preoxygenation: Pre-oxygenation with 100% oxygen Induction Type: IV induction Ventilation: Mask ventilation without difficulty Laryngoscope Size: Mac and 3 Grade View: Grade I Tube type: Oral Number of attempts: 1 Airway Equipment and Method: Stylet Placement Confirmation: ETT inserted through vocal cords under direct vision,  positive ETCO2 and breath sounds checked- equal and bilateral Secured at: 22 cm Tube secured with: Tape Dental Injury: Teeth and Oropharynx as per pre-operative assessment

## 2020-09-29 NOTE — Transfer of Care (Signed)
Immediate Anesthesia Transfer of Care Note  Patient: Caroline Hopkins  Procedure(s) Performed: XI ROBOTIC ASSISTED TOTAL LAPAROSCOPIC HYSTERECTOMY AND SALPINGECTOMY, IUD REMOVAL, LYSIS OF ADHESIONS (Bilateral )  Patient Location: PACU  Anesthesia Type:General  Level of Consciousness: drowsy and patient cooperative  Airway & Oxygen Therapy: Patient Spontanous Breathing and Patient connected to nasal cannula oxygen  Post-op Assessment: Report given to RN and Post -op Vital signs reviewed and stable  Post vital signs: Reviewed and stable  Last Vitals:  Vitals Value Taken Time  BP 112/75 09/29/20 1401  Temp    Pulse 83 09/29/20 1404  Resp 17 09/29/20 1404  SpO2 94 % 09/29/20 1404  Vitals shown include unvalidated device data.  Last Pain:  Vitals:   09/29/20 0729  TempSrc: Oral  PainSc: 0-No pain      Patients Stated Pain Goal: 5 (09/29/20 0729)  Complications: No complications documented.

## 2020-09-29 NOTE — H&P (Signed)
Caroline Hopkins Apr 11, 1981 MRN: [id number]  HPI The patient is a 38 y.o. G2P2 who presents today for scheduled robotic assisted total laparoscopic hysterectomy, removal of IUD, bilateral salpingectomy (with removal of right hydrosalpinx), possible cystoscopy for refractory abnormal uterine bleeding and dysmenorrhea.  No changes to her medical history since her pre op exam, denies CP, SOB, fever/chills, dysuria.   Past Medical History:  Diagnosis Date  . Allergy    seasonal  . Anemia   . Anxiety   . Chiari malformation type I (HCC)   . Depression   . GERD (gastroesophageal reflux disease)   . Headache   . History of kidney stones   . Pneumonia    every year  . Vitamin D deficiency    Past Surgical History:  Procedure Laterality Date  . CESAREAN SECTION  1999 and 2001  . FRACTURE SURGERY     left ankle repair x3  . SUBOCCIPITAL CRANIECTOMY CERVICAL LAMINECTOMY  09/11/2012   Procedure: SUBOCCIPITAL CRANIECTOMY CERVICAL LAMINECTOMY/DURAPLASTY;  Surgeon: Clydene Fake, MD;  Location: [facility];  Service: Neurosurgery;  Laterality: N/A;  Chiari decompression/Suboccipital craniectomy with Cervical one laminectomy/dural patch graft  . TUBAL LIGATION  2001   Allergies  Allergen Reactions  . Latex Anaphylaxis    Blisters   . Rocephin [Ceftriaxone] Anaphylaxis  . Oxycodone Nausea And Vomiting  . Wellbutrin [Bupropion]     Headaches    No current facility-administered medications on file prior to encounter.   Current Outpatient Medications on File Prior to Encounter  Medication Sig Dispense Refill  . acetaminophen (TYLENOL) 500 MG tablet Take 500 mg by mouth every 6 (six) hours as needed for moderate pain.    Marland Kitchen ibuprofen (ADVIL) 200 MG tablet Take 400 mg by mouth every 6 (six) hours as needed for moderate pain.    Marland Kitchen ibuprofen (ADVIL) 800 MG tablet TAKE ONE TABLET EVERY 8 HOURS AS NEEDED (Needs to be seen before next refill) (Patient taking differently: Take 800 mg by mouth every  8 (eight) hours as needed for moderate pain. ) 30 tablet 5  . norethindrone (ORTHO MICRONOR) 0.35 MG tablet Take 1 tablet (0.35 mg total) by mouth daily. 28 tablet 0  . omeprazole (PRILOSEC) 20 MG capsule TAKE ONE (1) CAPSULE EACH DAY (Patient taking differently: Take 20 mg by mouth daily. ) 90 capsule 3  . vitamin B-12 (CYANOCOBALAMIN) 1000 MCG tablet Take 1,000 mcg by mouth daily.    . nicotine (NICODERM CQ - DOSED IN MG/24 HOURS) 14 mg/24hr patch Place 1 patch (14 mg total) onto the skin daily. (Patient not taking: Reported on 09/19/2020) 28 patch 11       Physical Exam   BP 124/86   Pulse (!) 57   Temp 97.6 F (36.4 C) (Oral)   Resp 20   Ht 5\' 5"  (1.651 m)   Wt 107.2 kg   SpO2 98%   BMI 39.34 kg/m    General: Pleasant female, no acute distress, alert and oriented CV: RRR, no murmurs Pulm: good respiratory effort, CTAB     Plan Proceed with robotic assisted TLH with removal of IUD, bilateral salpingectomy (with removal of right hydrosalpinx), possible cystoscopy for refractory abnormal uterine bleeding and dysmenorrhea as planned.  Post operative recovery and pain medications reviewed.  Notes GI reaction to oxycodone so will use hydrocodone for breakthrough pain.  Potential for same day discharge reviewed.  All questions answered and the patient agrees to proceed.    , MD 09/29/20

## 2020-09-29 NOTE — OR Nursing (Signed)
IUD was removed by Dr. Penni Bombard at [phone].

## 2020-09-29 NOTE — Brief Op Note (Signed)
09/29/2020  1:54 PM  PATIENT:  Caroline Hopkins  39 y.o. female  PRE-OPERATIVE DIAGNOSIS:  refractory abnormal uterine bleeding, dysmenorrhea, malpositioned IUD  POST-OPERATIVE DIAGNOSIS:  refractory abnormal uterine bleeding, dysmenorrhea, malpositioned IUD  PROCEDURE:  Procedure(s): XI ROBOTIC ASSISTED TOTAL LAPAROSCOPIC HYSTERECTOMY AND SALPINGECTOMY, IUD REMOVAL, LYSIS OF ADHESIONS (Bilateral)  SURGEON:  Surgeon(s) and Role:    Theresia Majors, MD - Primary    * Genia Del, MD - Assisting  ANESTHESIA:   local and general  EBL:  50 ml    BLOOD ADMINISTERED:none  DRAINS: none   LOCAL MEDICATIONS USED:  BUPIVICAINE   SPECIMEN:  Source of Specimen:  uterus, cervix, bilateral fallopian tubes  DISPOSITION OF SPECIMEN:  PATHOLOGY  COUNTS:  YES  TOURNIQUET:  * No tourniquets in log *  DICTATION: .Note written in EPIC  PLAN OF CARE: Discharge to home after PACU if meeting criteria for discharge  PATIENT DISPOSITION:  PACU - hemodynamically stable.   Delay start of Pharmacological VTE agent (>24hrs) due to surgical blood loss or risk of bleeding: NA

## 2020-09-29 NOTE — Anesthesia Preprocedure Evaluation (Addendum)
Anesthesia Evaluation  Patient identified by MRN, date of birth, ID band Patient awake    Reviewed: Allergy & Precautions, NPO status , Patient's Chart, lab work & pertinent test results  Airway Mallampati: II  TM Distance: >3 FB Neck ROM: Full    Dental no notable dental hx. (+) Teeth Intact, Dental Advisory Given   Pulmonary pneumonia, resolved, former smoker,    Pulmonary exam normal breath sounds clear to auscultation       Cardiovascular negative cardio ROS Normal cardiovascular exam Rhythm:Regular Rate:Normal     Neuro/Psych  Headaches, PSYCHIATRIC DISORDERS Anxiety Depression Chiari Type 1 s/p craniectomy/cervical laminectomy 2013    GI/Hepatic Neg liver ROS, GERD  Medicated and Controlled,  Endo/Other  Morbid obesity  Renal/GU negative Renal ROS  negative genitourinary   Musculoskeletal  (+) Arthritis ,   Abdominal   Peds  Hematology negative hematology ROS (+)   Anesthesia Other Findings   Reproductive/Obstetrics                            Anesthesia Physical Anesthesia Plan  ASA: III  Anesthesia Plan: General   Post-op Pain Management:    Induction: Intravenous  PONV Risk Score and Plan: 3 and Midazolam, Dexamethasone and Ondansetron  Airway Management Planned: Oral ETT  Additional Equipment:   Intra-op Plan:   Post-operative Plan: Extubation in OR  Informed Consent: I have reviewed the patients History and Physical, chart, labs and discussed the procedure including the risks, benefits and alternatives for the proposed anesthesia with the patient or authorized representative who has indicated his/her understanding and acceptance.     Dental advisory given  Plan Discussed with: CRNA  Anesthesia Plan Comments:         Anesthesia Quick Evaluation

## 2020-09-30 ENCOUNTER — Encounter (HOSPITAL_BASED_OUTPATIENT_CLINIC_OR_DEPARTMENT_OTHER): Payer: Self-pay | Admitting: Obstetrics and Gynecology

## 2020-09-30 DIAGNOSIS — N939 Abnormal uterine and vaginal bleeding, unspecified: Secondary | ICD-10-CM | POA: Diagnosis not present

## 2020-09-30 LAB — SURGICAL PATHOLOGY

## 2020-09-30 MED ORDER — HYDROCODONE-ACETAMINOPHEN 5-325 MG PO TABS
ORAL_TABLET | ORAL | Status: AC
  Filled 2020-09-30: qty 2

## 2020-09-30 MED ORDER — IBUPROFEN 800 MG PO TABS
ORAL_TABLET | ORAL | Status: AC
  Filled 2020-09-30: qty 1

## 2020-09-30 MED ORDER — ACETAMINOPHEN 500 MG PO TABS
ORAL_TABLET | ORAL | Status: AC
  Filled 2020-09-30: qty 2

## 2020-09-30 NOTE — Discharge Summary (Signed)
DCSUMGYN Name: Caroline Hopkins  Age: 39 y.o.  Date of Birth: 09/22/1981  Medical Record #: [id number]   Discharge Summary  DISCHARGE DIAGNOSIS: 1. Refractory abnormal uterine bleeding 2. Dysmenorrhea 3. Malpositioned IUD 4. Severe uterine adhesions (to abdominal wall and bladder) 5. S/p Da Vinci XI robotic assisted total laparoscopic hysterectomy, bilateral salpingectomy, IUD removal, lysis of adhesions   ADMISSION DATE: 09/29/2020 DISMISSAL DATE: 09/30/2020  CONSULTS: NONE LENGTH OF STAY: 1 DAY  Caroline Hopkins was admitted for the above surgery and had no intraoperative complications.  She did require an overnight stay for further monitoring due to initial pain control and poor appetite.  Her postoperative course was otherwise uneventful and she is tolerating a general diet without restriction on discharge.  Pain is currently controlled with oral medications and the patient is voiding and ambulating well prior to discharge.  Vital signs: BP 119/79 (BP Location: Right Arm)   Pulse (!) 59   Temp 98.3 F (36.8 C)   Resp 18   Ht 5\' 5"  (1.651 m)   Wt 107.2 kg   SpO2 97%   BMI 39.34 kg/m   Patient is afebrile and normotensive at discharge. Abdomen is nondistended with positive bowel sounds.  Non labored respirations.  Incisions clean, dry and intact across the upper abdomen. No signs of erythema nor exudate. Labs are within expected limits at discharge.  Excellent urine output noted.  Discharge medications include Ibuprofen 600 mg to be taken every six hours as needed for pain, Norco 5-325 mg to be taken every four to six hours as needed for moderate to severe pain, and appropriate use of stool softeners to avoid postoperative constipation. A common recommendation is to use Colace 100 mg by mouth two times a day as needed (hold Colace for loose stools). Potential adverse reactions to current medications were discussed.  Current Outpatient Medications  Medication Instructions  .  acetaminophen (TYLENOL) 500 mg, Oral, Every 6 hours, Then as needed for pain  . Desvenlafaxine Succinate ER 25 mg, Oral, Daily, (Needs to be seen before next refill)  . docusate sodium (COLACE) 100 mg, Oral, 2 times daily PRN  . HYDROcodone-acetaminophen (NORCO/VICODIN) 5-325 MG tablet 1-2 tablets, Oral, Every 6 hours PRN, Substitute for acetaminophen dose as needed, max acetaminophen dose 4000 mg per day from all sources  . ibuprofen (ADVIL) 600 mg, Oral, Every 6 hours, Take with food. Then as needed for pain  . nicotine (NICODERM CQ - DOSED IN MG/24 HOURS) 14 mg, Transdermal, Daily  . omeprazole (PRILOSEC) 20 MG capsule TAKE ONE (1) CAPSULE EACH DAY  . vitamin B-12 (CYANOCOBALAMIN) 1,000 mcg, Oral, Daily    Patients may often be constipated after surgery due to prolonged periods of bedrest and use of oral narcotic analgesics. If there is no bowel movement for 3 days after surgery, or if the patient is uncomfortable and unable to pass stool, she will try one or all of the following measures: 1.  Milk of Magnesia, 30 cc by mouth every 12 hours  2.  Dulcolax suppository, one suppository per rectum every 6 hours 3.  Metamucil, Fibercon or other bulk former, used as directed 4.  Fleets Enema 5.  Prunes or Prune Juice 6.  Miralax The patient is instructed to contact the Ob/Gyn clinic if these measures are unsuccessful or accompanied by severe abdominal pain, nausea and emesis, or fever.  Emergency Medical Treatment and Active Labor Act (EMTALA):  Patient has no emergency condition and is stable for discharge.  Condition at discharge is independent but with activity restrictions of no lifting >20 lbs., no driving while on narcotic pain medications, and nothing per vagina for six to eight weeks.  Keep any incisions clean and dry by running soapy water over them and dabbing them dry. It is best to shower for the first week while the healing process is underway; after one week it is safe to take a bath.    Patient is to refrain from placing anything in the vagina within the next six weeks.  This pelvic rest includes intercourse, douching, and tampons.  The patient will call the [facility] for problems such as fever with chills, nausea, vomiting, constipation, heavy odorous vaginal discharge, burning, or pain associated with urination.  Also call for excessive vaginal bleeding, saturating more than one pad an hour for more than three consecutive hours.  Follow-up in the [facility] in 2 weeks for an incision check and in 6 weeks for a complete postoperative visit.  She is advised to return to clinic earlier if signs of fever, increasing pain, or other postoperative concerns arise.     Theresia Majors, M.D. 09/30/20

## 2020-09-30 NOTE — Anesthesia Postprocedure Evaluation (Signed)
Anesthesia Post Note  Patient: Caroline Hopkins  Procedure(s) Performed: XI ROBOTIC ASSISTED TOTAL LAPAROSCOPIC HYSTERECTOMY AND SALPINGECTOMY, IUD REMOVAL, LYSIS OF ADHESIONS (Bilateral )     Patient location during evaluation: PACU Anesthesia Type: General Level of consciousness: awake and alert Pain management: pain level controlled Vital Signs Assessment: post-procedure vital signs reviewed and stable Respiratory status: spontaneous breathing, nonlabored ventilation, respiratory function stable and patient connected to nasal cannula oxygen Cardiovascular status: blood pressure returned to baseline and stable Postop Assessment: no apparent nausea or vomiting Anesthetic complications: no   No complications documented.  Last Vitals:  Vitals:   09/30/20 0626 09/30/20 0930  BP: 130/77 119/79  Pulse: (!) 57 (!) 59  Resp: 20 18  Temp: 36.8 C 36.8 C  SpO2: 99% 97%    Last Pain:  Vitals:   09/30/20 0930  TempSrc:   PainSc: 5                  Dima Mini L Eboney [place]

## 2020-09-30 NOTE — Op Note (Signed)
Name: Caroline Hopkins  Age: 39 y.o.  Date of Birth: 02/22/1981  Medical Record #: [id number]  Operative Note  Preoperative Diagnosis: Refractory abnormal uterine bleeding, dysmenorrhea, malpositioned IUD Procedure: Da Vinci XI robotic assisted total laparoscopic hysterectomy, bilateral salpingectomy, IUD removal, lysis of adhesions Postoperative Diagnosis: same and severe uterine adhesions Surgeon: Theresia Majors, MD Assistant: Genia Del, MD (proctor and assistant), Lambert Mody RFA Estimated Blood Loss: 50 mL Anesthesia: General, local with 0.25% bupivacaine Findings: Normal sized uterus with IUD string visible at the external os, IUD felt to be at least partially embedded in the uterus with more application of more traction to the strings than normally needed in order to remove the device.  Cervix is small/nulliparous appearing, located deep in the vagina with poor uterine descensus noted.  Normal bilateral fallopian tubes with evidence of previous tubal ligation and a right-sided small <1 cm simple paratubal cyst, normal appearing bilateral ovaries.  Omental adhesion to the midline anterior abdominal wall at level of umbilicus.  Severe scarring of uterus to anterior abdominal wall and bladder. Complications: None. Date: 09/30/20  Under general anesthesia with endotracheal intubation Caroline Hopkins was placed in dorsolithotomy position with yellowfin stirrups.  She was prepped as usual with DuraPrep on the abdomen and with Betadine on the suprapubic, vulvar and vaginal areas.  The Foley was inserted in the bladder and productive of clear urine.  A surgical team time out was performed to verify and agree on procedure and patient consent.  900 mg IV clindamycin and 380 mg IV gentamicin (dose per pharmacy) were administered prior to the beginning of the procedure.  The weighted speculum was inserted in the vagina.  Traction was applied to the IUD strings with mild difficulty encountered with removal  of the device as described above in the findings section.  The IUD was removed in its entirety, a picture taken to confirm the removal, and the device was discarded.  The anterior lip of the cervix is grasped with a tenaculum.  The uterus sounded to 7 cm.  Dilation of the cervix was accomplished with with Shawnie Pons dilators to accommodate a V-care device, and the balloon was inflated with 10 mL of air. The cervical cup was placed and carefully checked to ensure that no vaginal tissue encroached the edges of the cup. Once placed, the vaginal cup was inserted and snugged up against the cervical cup and was locked in place. The speculum and tenaculum were removed.  Attention was directed to the abdomen.  The supraumbilical area was infiltrated with bupivacaine 0.25% plain.  A 1.5 cm incision was made at that level with the scalpel.  The aponeurosis was sharply opened with Mayo scissors under direct vision after applying the Kocher clamps to this layer for traction.  The parietal peritoneum was digitally opened bluntly.  A pursestring stitch of #0-Vicryl was placed on the aponeurosis.  The Hasson trocar was inserted and insufflation was started, achieving pneumoperitoneum of 15 mmHg.  The laparoscope was inserted.  The abdominal wall was free of adhesions where the other ports were to be inserted.  Intra-abdominal findings were noted as above.  The camera was removed.  Infiltration of bupivacaine 0.25% plain was applied at all four other port sites which were marked on the skin and measured approximately 8 cm from each other.  Small incisions were made with the scalpel at those levels.  Insertion of trocars was performed under direct vision with 2 robotic ports on the right in line with the umbilicus  and 1 robotic ports on the distal left in line with the umbilicus.  The assistant port was inserted medially into the left the umbilicus.  The table was positioned in 30 degree Trendelenburg for robot docking.  The patient  tolerated that position well.  The robot was docked from the right side of the patient.  Targeting was performed.  The other arms were docked.  Robotic instruments were inserted under direct vision with the fenestrated clamp in the fourth arm, the scissors in the third arm and the long tipped bipolar device in the first arm.    At the [employer] console, further inspection of the pelvis revealed both ureters in normal anatomic position with good vermiculation.   The omental adhesion at the level of the umbilicus was taken down with bipolar fulguration of the vascular pedicle and transection with monopolar endoscopic scissors.  Hemostasis of the tissues was noted.  The right mesosalpinx was fulgurated with bipolar energy and transected with the Endo scissors.  The fallopian tube was left attached to the uterus including the paratubal cyst.  The right utero-ovarian ligament was fulgurated and transected.  The right round ligament was fulgurated and transected.  The anterior visceral peritoneum was opened.  This sequence was repeated on the left side of the uterus, and the fallopian tube was left attached to the uterus.  The left utero-ovarian ligament was fulgurated and transected.  At this point the assistant noted that the device had fallen out of the uterus, as the intrauterine balloon had ruptured.  Attention was redirected vaginally.  The intrauterine balloon of a second V-care device was tested and after inflation with only 5 mL of air the balloon ruptured.  Staff was notified of the suspicion of a defective lot of V-care devices.  The cervix was grasped with single-tooth tenaculum.  A #8 Rumi with the small Koh ring were put in place with a fair amount of difficulty due to the very narrow vaginal canal and severely anteverted orientation of the uterus due to the adhesions to the abdominal wall.   Once placed, the intrauterine balloon was inflated with 10 mL of air.  The cervical cup was placed and carefully  checked to ensure that no vaginal tissue encroached the edges of the cup.   Attention was then directed back to the robotic portion of the surgery.  Extensive lysis of adhesions was performed to free the uterus from the anterior abdominal wall taking care to avoid cystotomy.  This was done meticulously and very close to the uterus to avoid trauma to surrounding tissues and organs.  Approximately 1 hour spent in dissecting these adhesions to find a plane to safely create a bladder flap.  The bladder was gently pushed inferiorly beyond the [facility] ring. The vaginal occluder was inflated by the assistant.  The colpotomy was started as the upper aspect of the vagina was then opened with the tip of the scissors using monopolar energy over the Koh ring anteriorly, then down the sides and posteriorly to completely detach the uterus with the cervix and both tubes.  The Rumi and Koh ring were removed by the assistant and the specimen was delivered vaginally and sent to pathology for analysis.  The vaginal occluder was replaced in the vagina by the assistant.  Adequate hemostasis was verified at all levels. The instruments were changed to the cutting needle driver in the third arm and the long tip clamp in the first arm.  A #0 V-Loc 9 inch suture  was used to close the vaginal vault.  The first stitch was placed at the right vaginal apex and a running suture was completed all the way to the left vaginal apex and then the remainder of the suture was used to reinforce the vaginal cuff with a second closure layer.  The suture was cut and the needle was removed from the abdomen.   Both ureters were seen intact with continued vermiculation.  Urine in the Foley bag remained clear.  The robotic instruments were removed under direct vision.  The robot was undocked.    Laparoscope was reinserted into the abdomen and the area was irrigated and suctioned.  Excellent hemostasis was noted at all pedicles.   All laparoscopic instruments  were removed.  The pneumoperitoneum was evacuated.  The supraumbilical incision was closed by pulling the pursestring stitch closed at the aponeurosis.  Palpation after closure indicated an ongoing fascial defect.  Fascia was grasped with Kocher clamps and the fascial defect was reapproximated with 2 figure-of-eight stitches using #0 Vicryl suture.  All incisions were closed at the skin with a subcuticular suture of #4-0 Vicryl.  Dermabond was applied over the top of the incisions.  The vaginal occluder was removed from the vagina.    Sponge, instrument, needle counts were correct x2. The patient tolerated the procedure well and was brought to the recovery room in stable condition.  The surgical assist was present for the full case and was needed to assist with retraction, visualization, and wound closure.   Theresia Majors, M.D., [facility]

## 2020-10-01 ENCOUNTER — Telehealth: Payer: Self-pay | Admitting: Obstetrics and Gynecology

## 2020-10-01 NOTE — Telephone Encounter (Signed)
I called the patient to check in on her POD#2.  She says she is doing okay, had significant shoulder pains last night presumably from some retained pneumoperitoneum, but that has significantly improved today.  She did take 2 Norco tablets last night and was able to sleep okay.  Otherwise just taking OTC meds for pain control.  Reports no bowel movement yet but no problems with nausea or vomiting.  She is taking Colace.  Voiding fine.  No excessive bleeding.  Ambulating.  No concerns at this time.  She will plan to call to make her follow-up appointments soon.  Theresia Majors, MD

## 2020-10-15 ENCOUNTER — Ambulatory Visit (INDEPENDENT_AMBULATORY_CARE_PROVIDER_SITE_OTHER): Payer: No Typology Code available for payment source | Admitting: Obstetrics and Gynecology

## 2020-10-15 ENCOUNTER — Encounter: Payer: Self-pay | Admitting: Obstetrics and Gynecology

## 2020-10-15 ENCOUNTER — Other Ambulatory Visit: Payer: Self-pay

## 2020-10-15 VITALS — BP 118/76

## 2020-10-15 DIAGNOSIS — Z90710 Acquired absence of both cervix and uterus: Secondary | ICD-10-CM

## 2020-10-15 NOTE — Progress Notes (Signed)
   Caroline Hopkins 1981/08/15 [id number]  SUBJECTIVE   REASON FOR APPOINTMENT Chief Complaint  Patient presents with  . Post Op check     HISTORY OF PRESENT ILLNESS Caroline Hopkins presents for routine 2 week post-operative follow up from a Da Vinci XI robotic assisted total laparoscopic hysterectomy, bilateral salpingectomy, IUD removal, lysis of adhesions performed on 09/30/2020 for refractory abnormal uterine bleeding, dysmenorrhea, malpositioned IUD, and severe uterine adhesions to the abdominal wall/bladder. The patient is doing well with no concerns.  She denies vaginal bleeding or discharge. She is tolerating a normal diet with slightly reduced appetite but no nausea.  Bowel and bladder function are normal.  Has noted a few isolated self resolving hot flashes since the surgery.  Pathology report was benign, indicating adenomyosis and paratubal cysts.    OBJECTIVE  BP 118/76   LMP 07/02/2020    PHYSICAL EXAM General:  Patient is no acute distress she is alert and oriented Abdomen:  Soft, nontender, nondistended.  No mass, rebound, nor guarding is appreciated.  Incisions across the upper abdomen and supraumbilical area appear well-healing and are clean dry and intact x5 without drainage nor erythema.  Resolving pockets of ecchymoses surrounding a few of the incisions.   ASSESSMENT / PLAN  Routine 2 week post operative check.   The patient is doing well and meeting all postoperative milestones.  Reassured her that the hot flashes should improve, and are a fairly common effect after an ovary-sparing hysterectomy due to the blood flow pattern changes to the ovary.  I had previously reviewed the benign pathology report from hysterectomy specimen with the patient via MyChart message.  I showed her some pictures taken during the laparoscopy.  We reviewed that she should continue to follow the postoperative activity limitations/restrictions, usual post hysterectomy precautions are reviewed.   She did request a return to work note for a future date for the fifth week of her recovery, and she would be only working from home remotely which would be fine, so I did give her a written work note to release her to work 11/03/2020, and she understands that she will need to continue to follow the postoperative restrictions for at least another week after that. RTC 1 month for final postoperative checkup   Caroline Majors MD 10/15/20

## 2020-10-16 ENCOUNTER — Other Ambulatory Visit (HOSPITAL_COMMUNITY): Payer: Self-pay

## 2020-10-17 ENCOUNTER — Encounter: Payer: Self-pay | Admitting: Gynecology

## 2020-11-14 ENCOUNTER — Ambulatory Visit (INDEPENDENT_AMBULATORY_CARE_PROVIDER_SITE_OTHER): Payer: No Typology Code available for payment source | Admitting: Obstetrics and Gynecology

## 2020-11-14 ENCOUNTER — Other Ambulatory Visit: Payer: Self-pay

## 2020-11-14 ENCOUNTER — Encounter: Payer: Self-pay | Admitting: Obstetrics and Gynecology

## 2020-11-14 VITALS — BP 122/80

## 2020-11-14 DIAGNOSIS — N800 Endometriosis of uterus: Secondary | ICD-10-CM

## 2020-11-14 DIAGNOSIS — N8003 Adenomyosis of the uterus: Secondary | ICD-10-CM

## 2020-11-14 DIAGNOSIS — Z90710 Acquired absence of both cervix and uterus: Secondary | ICD-10-CM

## 2020-11-14 NOTE — Progress Notes (Signed)
   Caroline Hopkins 07-13-1981 [id number]  SUBJECTIVE   REASON FOR APPOINTMENT Chief Complaint  Patient presents with  . Routine Post Op    XI ROBOTIC ASSISTED TOTAL LAPAROSCOPIC HYSTERECTOMY AND SALPINGECTOMY, IUD REMOVAL, LYSIS OF ADHESIONS (Bilateral )      HISTORY OF PRESENT ILLNESS Caroline Hopkins presents for routine 2 week post-operative follow up from a Da Vinci XI robotic assisted total laparoscopic hysterectomy, bilateral salpingectomy, IUD removal, lysis of adhesions performed on 09/30/2020 for refractory abnormal uterine bleeding, dysmenorrhea, malpositioned IUD, and severe uterine adhesions to the abdominal wall/bladder. The patient is doing well with no concerns.  She denies vaginal bleeding or discharge. She is tolerating a normal diet.  Bowel and bladder function are normal.  Continues to have a few isolated minor hot flashes since the surgery.  Pathology report was benign, indicating adenomyosis and paratubal cysts.    OBJECTIVE  BP 122/80 (BP Location: Right Arm, Patient Position: Sitting, Cuff Size: Normal)   LMP 07/02/2020    PHYSICAL EXAM General:  Patient is no acute distress she is alert and oriented Abdomen:  Soft, nontender, nondistended.  No mass, rebound, nor guarding is appreciated.  Incisions across the upper abdomen and supraumbilical area appear well-healing and are clean dry and intact x5 without drainage nor erythema.   Pelvic:  External genitalia appear to be within normal limits consistent with postmenopausal status.  Bartholin's and Skene glands and urethra appear to be within normal limits.  There are no lesions on the labia or vaginal tissues.  Vaginal epithelium is normal.  There is no vaginal bleeding or abnormal discharge. Vaginal cuff appears intact without visible suture line or granulation tissue.  Bimanual exam indicates that the vaginal cuff is intact and without tenderness.  There is no adnexal mass, fullness, nor tenderness.  Caroline Hopkins  present during the examination   ASSESSMENT / PLAN  Routine 6 week post operative check.   The patient is doing well and meeting all postoperative milestones.  I reassured her that the hot flashes should improve, and are a fairly common effect after an ovary-sparing hysterectomy due to the blood flow pattern changes to the ovary.  I imagine these will get better the longer it has been since the procedure, she says they are manageable/tolerable so no need for intervention at this time.  She has returned to light duty at work and has been doing fine other than being a little tired.  She may resume full normal activities gradually at this point this far out from the surgery.  Reviewed with her that given her abnormal Pap smear history including that in 2020 showing LGSIL cytology, she should continue getting routine Pap smears from the vaginal cuff for at least the next 20 years.  The pathology analysis of the cervical tissues after the hysterectomy did not show any evidence of dysplasia. She will follow-up with any ongoing issues or concerns.   Caroline Majors MD 11/14/20

## 2020-11-28 ENCOUNTER — Other Ambulatory Visit: Payer: Self-pay | Admitting: Family Medicine

## 2020-11-28 NOTE — Telephone Encounter (Signed)
Dettinger NTBS 30 days given 09/24/20 

## 2020-12-01 NOTE — Telephone Encounter (Signed)
CALLED PATIENT, NO ANSWER, LEFT MESSAGE TO RETURN CALL 

## 2020-12-25 ENCOUNTER — Ambulatory Visit: Payer: No Typology Code available for payment source | Admitting: Family Medicine

## 2020-12-25 ENCOUNTER — Encounter: Payer: Self-pay | Admitting: Family Medicine

## 2020-12-25 ENCOUNTER — Other Ambulatory Visit: Payer: Self-pay

## 2020-12-25 VITALS — BP 106/74 | HR 63 | Temp 98.1°F | Ht 65.0 in | Wt 223.0 lb

## 2020-12-25 DIAGNOSIS — F5104 Psychophysiologic insomnia: Secondary | ICD-10-CM

## 2020-12-25 DIAGNOSIS — E559 Vitamin D deficiency, unspecified: Secondary | ICD-10-CM

## 2020-12-25 DIAGNOSIS — E538 Deficiency of other specified B group vitamins: Secondary | ICD-10-CM | POA: Diagnosis not present

## 2020-12-25 DIAGNOSIS — K219 Gastro-esophageal reflux disease without esophagitis: Secondary | ICD-10-CM

## 2020-12-25 DIAGNOSIS — F339 Major depressive disorder, recurrent, unspecified: Secondary | ICD-10-CM | POA: Diagnosis not present

## 2020-12-25 MED ORDER — VITAMIN B-12 1000 MCG PO TABS: 1000.0000 ug | ORAL_TABLET | Freq: Every day | ORAL | 3 refills | Status: DC

## 2020-12-25 MED ORDER — DESVENLAFAXINE SUCCINATE ER 25 MG PO TB24: 25.0000 mg | ORAL_TABLET | Freq: Every day | ORAL | 3 refills | Status: DC

## 2020-12-25 MED ORDER — OMEPRAZOLE 20 MG PO CPDR: DELAYED_RELEASE_CAPSULE | ORAL | 3 refills | Status: DC

## 2020-12-25 NOTE — Progress Notes (Signed)
BP 106/74   Pulse 63   Temp 98.1 F (36.7 C)   Ht 5\' 5"  (1.651 m)   Wt 223 lb (101.2 kg)   LMP 07/02/2020   SpO2 100%   BMI 37.11 kg/m    Subjective:   Patient ID: 07/04/2020, female    DOB: 08/11/1981, 40 y.o.   MRN: [id number]  HPI: Caroline Hopkins is a 40 y.o. female presenting on 12/25/2020 for Medication Refill   HPI Depression and anxiety and Insomnia Patient is coming in for depression and anxiety and insomnia recheck.  She says she is feeling great and feeling better than she ever has.  She has had some memories of her loss of her child recently but other than that she has been feeling great and even then it was not severe as it has been in the past.  She has been really happy and feels like the Pristiq is doing very well for her.  GERD Patient is currently on omeprazole.  She denies any major symptoms or abdominal pain or belching or burping. She denies any blood in her stool or lightheadedness or dizziness.   Recheck for B12 deficiency Patient is coming in for recheck for B12 deficiency and is taking oral B12 and feels like things are going pretty well with that.  Relevant past medical, surgical, family and social history reviewed and updated as indicated. Interim medical history since our last visit reviewed. Allergies and medications reviewed and updated.  Review of Systems  Constitutional: Negative for chills and fever.  Eyes: Negative for visual disturbance.  Respiratory: Negative for chest tightness and shortness of breath.   Cardiovascular: Negative for chest pain and leg swelling.  Musculoskeletal: Negative for back pain and gait problem.  Skin: Negative for rash.  Neurological: Negative for light-headedness and headaches.  Psychiatric/Behavioral: Negative for agitation, behavioral problems, dysphoric mood, self-injury, sleep disturbance and suicidal ideas. The patient is not nervous/anxious.   All other systems reviewed and are negative.   Per HPI  unless specifically indicated above   Allergies as of 12/25/2020      Reactions   Latex Anaphylaxis   Blisters    Rocephin [ceftriaxone] Anaphylaxis   Oxycodone Nausea And Vomiting   Wellbutrin [bupropion]    Headaches      Medication List       Accurate as of December 25, 2020  2:53 PM. If you have any questions, ask your nurse or doctor.        Desvenlafaxine Succinate ER 25 MG Tb24 Take 25 mg by mouth daily. (Needs to be seen before next refill)   omeprazole 20 MG capsule Commonly known as: PRILOSEC TAKE ONE (1) CAPSULE EACH DAY What changed:   how much to take  how to take this  when to take this  additional instructions   vitamin B-12 1000 MCG tablet Commonly known as: CYANOCOBALAMIN Take 1,000 mcg by mouth daily.        Objective:   BP 106/74   Pulse 63   Temp 98.1 F (36.7 C)   Ht 5\' 5"  (1.651 m)   Wt 223 lb (101.2 kg)   LMP 07/02/2020   SpO2 100%   BMI 37.11 kg/m   Wt Readings from Last 3 Encounters:  12/25/20 223 lb (101.2 kg)  09/29/20 236 lb 6.4 oz (107.2 kg)  09/25/20 236 lb (107 kg)    Physical Exam Vitals and nursing note reviewed.  Constitutional:      General: She is  not in acute distress.    Appearance: She is well-developed and well-nourished. She is not diaphoretic.  Eyes:     Extraocular Movements: EOM normal.     Conjunctiva/sclera: Conjunctivae normal.  Cardiovascular:     Rate and Rhythm: Normal rate and regular rhythm.     Pulses: Intact distal pulses.     Heart sounds: Normal heart sounds. No murmur heard.   Pulmonary:     Effort: Pulmonary effort is normal. No respiratory distress.     Breath sounds: Normal breath sounds. No wheezing.  Musculoskeletal:        General: No edema.  Skin:    General: Skin is warm and dry.     Findings: No rash.  Neurological:     Mental Status: She is alert and oriented to person, place, and time.     Coordination: Coordination normal.  Psychiatric:        Mood and Affect:  Mood and affect normal.        Behavior: Behavior normal.       Assessment & Plan:   Problem List Items Addressed This Visit      Digestive   GERD (gastroesophageal reflux disease)   Relevant Medications   omeprazole (PRILOSEC) 20 MG capsule     Other   Depression, recurrent (HCC) - Primary   Relevant Medications   Desvenlafaxine Succinate ER 25 MG TB24   Other Relevant Orders   TSH   Insomnia   Relevant Medications   Desvenlafaxine Succinate ER 25 MG TB24   Other Relevant Orders   TSH    Other Visit Diagnoses    B12 deficiency       Vitamin D deficiency          Will check blood work and continue current medication, no changes. Follow up plan: Return in about 6 months (around 06/24/2021), or if symptoms worsen or fail to improve, for Depression and insomnia and GERD recheck.  Counseling provided for all of the vaccine components No orders of the defined types were placed in this encounter.   Arville Care, MD [facility] 12/25/2020, 2:53 PM

## 2020-12-25 NOTE — Addendum Note (Signed)
Addended by: Arville Care on: 12/25/2020 03:20 PM   Modules accepted: Orders

## 2020-12-26 LAB — CBC WITH DIFFERENTIAL/PLATELET
Basophils Absolute: 0.1 10*3/uL (ref 0.0–0.2)
Basos: 1 %
EOS (ABSOLUTE): 0.3 10*3/uL (ref 0.0–0.4)
Eos: 4 %
Hematocrit: 41.5 % (ref 34.0–46.6)
Hemoglobin: 13.9 g/dL (ref 11.1–15.9)
Immature Grans (Abs): 0 10*3/uL (ref 0.0–0.1)
Immature Granulocytes: 0 %
Lymphocytes Absolute: 2.5 10*3/uL (ref 0.7–3.1)
Lymphs: 31 %
MCH: 30.2 pg (ref 26.6–33.0)
MCHC: 33.5 g/dL (ref 31.5–35.7)
MCV: 90 fL (ref 79–97)
Monocytes Absolute: 0.4 10*3/uL (ref 0.1–0.9)
Monocytes: 6 %
Neutrophils Absolute: 4.7 10*3/uL (ref 1.4–7.0)
Neutrophils: 58 %
Platelets: 254 10*3/uL (ref 150–450)
RBC: 4.6 x10E6/uL (ref 3.77–5.28)
RDW: 12.8 % (ref 11.7–15.4)
WBC: 8 10*3/uL (ref 3.4–10.8)

## 2020-12-26 LAB — CMP14+EGFR
ALT: 23 IU/L (ref 0–32)
AST: 18 IU/L (ref 0–40)
Albumin/Globulin Ratio: 1.7 (ref 1.2–2.2)
Albumin: 4.6 g/dL (ref 3.8–4.8)
Alkaline Phosphatase: 53 IU/L (ref 44–121)
BUN/Creatinine Ratio: 21 (ref 9–23)
BUN: 13 mg/dL (ref 6–24)
Bilirubin Total: 0.5 mg/dL (ref 0.0–1.2)
CO2: 25 mmol/L (ref 20–29)
Calcium: 9.8 mg/dL (ref 8.7–10.2)
Chloride: 101 mmol/L (ref 96–106)
Creatinine, Ser: 0.61 mg/dL (ref 0.57–1.00)
GFR calc Af Amer: 130 mL/min/{1.73_m2} (ref >59)
GFR calc non Af Amer: 113 mL/min/{1.73_m2} (ref >59)
Globulin, Total: 2.7 g/dL (ref 1.5–4.5)
Glucose: 72 mg/dL (ref 65–99)
Potassium: 4 mmol/L (ref 3.5–5.2)
Sodium: 141 mmol/L (ref 134–144)
Total Protein: 7.3 g/dL (ref 6.0–8.5)

## 2020-12-26 LAB — LIPID PANEL
Chol/HDL Ratio: 4 ratio (ref 0.0–4.4)
Cholesterol, Total: 156 mg/dL (ref 100–199)
HDL: 39 mg/dL — ABNORMAL LOW (ref >39)
LDL Chol Calc (NIH): 97 mg/dL (ref 0–99)
Triglycerides: 106 mg/dL (ref 0–149)
VLDL Cholesterol Cal: 20 mg/dL (ref 5–40)

## 2020-12-26 LAB — TSH: TSH: 0.939 u[IU]/mL (ref 0.450–4.500)

## 2021-02-05 ENCOUNTER — Other Ambulatory Visit: Payer: Self-pay | Admitting: Family Medicine

## 2021-02-05 DIAGNOSIS — Z1231 Encounter for screening mammogram for malignant neoplasm of breast: Secondary | ICD-10-CM

## 2021-04-03 ENCOUNTER — Other Ambulatory Visit: Payer: Self-pay | Admitting: Family Medicine

## 2021-04-17 ENCOUNTER — Ambulatory Visit: Payer: No Typology Code available for payment source | Admitting: Family Medicine

## 2021-04-17 ENCOUNTER — Other Ambulatory Visit: Payer: Self-pay

## 2021-04-17 ENCOUNTER — Encounter: Payer: Self-pay | Admitting: Family Medicine

## 2021-04-17 VITALS — BP 125/85 | HR 79 | Ht 65.0 in | Wt 224.0 lb

## 2021-04-17 DIAGNOSIS — B070 Plantar wart: Secondary | ICD-10-CM

## 2021-04-17 NOTE — Progress Notes (Signed)
BP 125/85   Pulse 79   Ht 5\' 5"  (1.651 m)   Wt 224 lb (101.6 kg)   LMP 07/02/2020   SpO2 98%   BMI 37.28 kg/m    Subjective:   Patient ID: 07/04/2020, female    DOB: 1980-11-24, 40 y.o.   MRN: [id number]  HPI: Caroline Hopkins is a 40 y.o. female presenting on 04/17/2021 for Plantar Warts   HPI Plantar wart Patient has been fighting this plantar wart that is been there for some time and that will shrink with treatments and then it will grow again and now its been as large as it has been and painful and is on the bottom of her left foot on the outside part of it in the middle.  She says it is starting to get to where it hurts when she steps on it makes it more painful to walk.  She did cryotherapy treatments for a few times with 06/17/2021 and then she went to dermatology and did imiquimod and now she would like to go see a podiatrist.  She also tried some over-the-counter stuff and they all seem to help some and then it comes right back and worse.  Relevant past medical, surgical, family and social history reviewed and updated as indicated. Interim medical history since our last visit reviewed. Allergies and medications reviewed and updated.  Review of Systems  Constitutional:  Negative for chills and fever.  Skin:  Positive for rash. Negative for color change and wound.   Per HPI unless specifically indicated above   Allergies as of 04/17/2021       Reactions   Latex Anaphylaxis   Blisters    Rocephin [ceftriaxone] Anaphylaxis   Oxycodone Nausea And Vomiting   Wellbutrin [bupropion]    Headaches        Medication List        Accurate as of April 17, 2021  4:14 PM. If you have any questions, ask your nurse or doctor.          STOP taking these medications    vitamin B-12 1000 MCG tablet Commonly known as: CYANOCOBALAMIN Stopped by: April 19, 2021 Girolamo Lortie, MD       TAKE these medications    Desvenlafaxine Succinate ER 25 MG Tb24 Take 25 mg by mouth daily. (Needs  to be seen before next refill)   ibuprofen 800 MG tablet Commonly known as: ADVIL Take 800 mg by mouth 3 (three) times daily.   omeprazole 20 MG capsule Commonly known as: PRILOSEC TAKE ONE (1) CAPSULE EACH DAY         Objective:   BP 125/85   Pulse 79   Ht 5\' 5"  (1.651 m)   Wt 224 lb (101.6 kg)   LMP 07/02/2020   SpO2 98%   BMI 37.28 kg/m   Wt Readings from Last 3 Encounters:  04/17/21 224 lb (101.6 kg)  12/25/20 223 lb (101.2 kg)  09/29/20 236 lb 6.4 oz (107.2 kg)    Physical Exam Vitals and nursing note reviewed.  Constitutional:      Appearance: Normal appearance.  Skin:    General: Skin is warm and dry.     Findings: Lesion (Dime sized plantar wart on the left lateral midfoot) present.  Neurological:     Mental Status: She is alert.      Assessment & Plan:   Problem List Items Addressed This Visit   None Visit Diagnoses     Plantar wart of  left foot    -  Primary   Relevant Orders   Ambulatory referral to Podiatry       Tried treatments before with cryotherapy, tried going to dermatology, now she would like to see podiatry and see if they can do something different.  Dermatology only gave her imiquimod and it did not seem to help Follow up plan: Return if symptoms worsen or fail to improve.  Counseling provided for all of the vaccine components Orders Placed This Encounter  Procedures   Ambulatory referral to Podiatry    Arville Care, MD [facility] 04/17/2021, 4:14 PM

## 2021-04-30 ENCOUNTER — Other Ambulatory Visit: Payer: Self-pay

## 2021-04-30 ENCOUNTER — Encounter: Payer: Self-pay | Admitting: Family Medicine

## 2021-04-30 ENCOUNTER — Ambulatory Visit: Payer: No Typology Code available for payment source | Admitting: Family Medicine

## 2021-04-30 VITALS — BP 119/81 | HR 68 | Temp 97.9°F | Ht 65.0 in | Wt 225.0 lb

## 2021-04-30 DIAGNOSIS — L309 Dermatitis, unspecified: Secondary | ICD-10-CM | POA: Diagnosis not present

## 2021-04-30 MED ORDER — LEVOCETIRIZINE DIHYDROCHLORIDE 5 MG PO TABS: 5.0000 mg | ORAL_TABLET | Freq: Every evening | ORAL | 5 refills | Status: DC

## 2021-04-30 MED ORDER — TRIAMCINOLONE ACETONIDE 0.1 % EX CREA: 1.0000 "application " | TOPICAL_CREAM | Freq: Two times a day (BID) | CUTANEOUS | 2 refills | Status: DC

## 2021-04-30 MED ORDER — PREDNISONE 10 MG (21) PO TBPK: ORAL_TABLET | ORAL | 0 refills | Status: DC

## 2021-04-30 NOTE — Progress Notes (Signed)
Assessment & Plan:  1. Dermatitis Education provided on dermatitis. To let us know if she would like to be referred back to an allergy specialist.  - predniSONE (STERAPRED UNI-PAK 21 TAB) 10 MG (21) TBPK tablet; As directed x 6 days  Dispense: 21 tablet; Refill: 0 - levocetirizine (XYZAL) 5 MG tablet; Take 1 tablet (5 mg total) by mouth every evening.  Dispense: 30 tablet; Refill: 5 - triamcinolone cream (KENALOG) 0.1 %; Apply 1 application topically 2 (two) times daily.  Dispense: 80 g; Refill: 2   Follow up plan: Return if symptoms worsen or fail to improve.  Deliah Boston, MSN, APRN, FNP-C [facility]  Subjective:   Patient ID: Caroline Hopkins, female    DOB: February 26, 1981, 40 y.o.   MRN: [id number]  HPI: Caroline Hopkins is a 40 y.o. female presenting on 04/30/2021 for Rash (Bilateral arms x 1 week. Patient states she is allergic to the sun )  Patient c/o a rash to both arms after being in the sun for the past 3 weekends. States this happens every summer, that she is allergic to the sun. She has been taking Benadryl. She normally has some triamcinolone cream but has run out. Has seen an allergy specialist 10-15 years ago.   ROS: Negative unless specifically indicated above in HPI.   Relevant past medical history reviewed and updated as indicated.   Allergies and medications reviewed and updated.   Current Outpatient Medications:    Desvenlafaxine Succinate ER 25 MG TB24, Take 25 mg by mouth daily. (Needs to be seen before next refill), Disp: 90 tablet, Rfl: 3   ibuprofen (ADVIL) 800 MG tablet, Take 800 mg by mouth 3 (three) times daily., Disp: , Rfl:    omeprazole (PRILOSEC) 20 MG capsule, TAKE ONE (1) CAPSULE EACH DAY, Disp: 90 capsule, Rfl: 3  Allergies  Allergen Reactions   Latex Anaphylaxis    Blisters    Rocephin [Ceftriaxone] Anaphylaxis   Oxycodone Nausea And Vomiting   Wellbutrin [Bupropion]     Headaches    Objective:   BP 119/81   Pulse  68   Temp 97.9 F (36.6 C) (Temporal)   Ht 5\' 5"  (1.651 m)   Wt 225 lb (102.1 kg)   LMP 07/02/2020   SpO2 96%   BMI 37.44 kg/m    Physical Exam Vitals reviewed.  Constitutional:      General: She is not in acute distress.    Appearance: Normal appearance. She is not ill-appearing, toxic-appearing or diaphoretic.  HENT:     Head: Normocephalic and atraumatic.  Eyes:     General: No scleral icterus.       Right eye: No discharge.        Left eye: No discharge.     Conjunctiva/sclera: Conjunctivae normal.  Cardiovascular:     Rate and Rhythm: Normal rate.  Pulmonary:     Effort: Pulmonary effort is normal. No respiratory distress.  Musculoskeletal:        General: Normal range of motion.     Cervical back: Normal range of motion.  Skin:    General: Skin is warm and dry.     Capillary Refill: Capillary refill takes less than 2 seconds.     Findings: Rash (bilateral arms) present. Rash is papular and urticarial.  Neurological:     General: No focal deficit present.     Mental Status: She is alert and oriented to person, place, and time. Mental status is at baseline.  Psychiatric:        Mood and Affect: Mood normal.        Behavior: Behavior normal.        Thought Content: Thought content normal.        Judgment: Judgment normal.

## 2021-05-12 ENCOUNTER — Encounter: Payer: Self-pay | Admitting: Family Medicine

## 2021-05-12 DIAGNOSIS — L309 Dermatitis, unspecified: Secondary | ICD-10-CM

## 2021-06-04 ENCOUNTER — Other Ambulatory Visit: Payer: Self-pay | Admitting: Family Medicine

## 2021-06-24 ENCOUNTER — Encounter: Payer: Self-pay | Admitting: Family Medicine

## 2021-06-24 ENCOUNTER — Other Ambulatory Visit: Payer: Self-pay

## 2021-06-24 ENCOUNTER — Ambulatory Visit: Payer: No Typology Code available for payment source | Admitting: Family Medicine

## 2021-06-24 VITALS — BP 126/89 | HR 69 | Ht 65.0 in | Wt 222.0 lb

## 2021-06-24 DIAGNOSIS — K219 Gastro-esophageal reflux disease without esophagitis: Secondary | ICD-10-CM

## 2021-06-24 DIAGNOSIS — F5104 Psychophysiologic insomnia: Secondary | ICD-10-CM | POA: Diagnosis not present

## 2021-06-24 DIAGNOSIS — F339 Major depressive disorder, recurrent, unspecified: Secondary | ICD-10-CM | POA: Diagnosis not present

## 2021-06-24 MED ORDER — DESVENLAFAXINE SUCCINATE ER 50 MG PO TB24: 50.0000 mg | ORAL_TABLET | Freq: Every day | ORAL | 3 refills | Status: DC

## 2021-06-24 NOTE — Progress Notes (Signed)
BP 126/89   Pulse 69   Ht 5\' 5"  (1.651 m)   Wt 222 lb (100.7 kg)   LMP 07/02/2020   SpO2 95%   BMI 36.94 kg/m    Subjective:   Patient ID: 07/04/2020, female    DOB: 1981-03-12, 40 y.o.   MRN: [id number]  HPI: Caroline Hopkins is a 40 y.o. female presenting on 06/24/2021 for Medical Management of Chronic Issues, Anxiety, and Depression   HPI Anxiety and depression recheck Patient is coming in today for anxiety and depression recheck.  She says the Pristiq still helps but not helping like it was before and she would like to increase the dose.  She does admit that she does not take it every day and needs to be more consistent about it.  Patient denies any suicidal ideations or thoughts of hurting herself.  Patient also says she been having some difficulty with sleeping with it and some energy down and would like to get some blood levels tested to see if anything is affecting her energy or affecting her sleep.    GERD Patient is currently on omeprazole.  She denies any major symptoms or abdominal pain or belching or burping. She denies any blood in her stool or lightheadedness or dizziness.   Relevant past medical, surgical, family and social history reviewed and updated as indicated. Interim medical history since our last visit reviewed. Allergies and medications reviewed and updated.  Review of Systems  Constitutional:  Negative for chills and fever.  Eyes:  Negative for visual disturbance.  Respiratory:  Negative for chest tightness and shortness of breath.   Cardiovascular:  Negative for chest pain and leg swelling.  Skin:  Negative for rash.  Neurological:  Negative for light-headedness and headaches.  Psychiatric/Behavioral:  Positive for sleep disturbance. Negative for agitation, behavioral problems, self-injury and suicidal ideas. The patient is nervous/anxious.   All other systems reviewed and are negative.  Per HPI unless specifically indicated above   Allergies as  of 06/24/2021       Reactions   Latex Anaphylaxis   Blisters    Rocephin [ceftriaxone] Anaphylaxis   Oxycodone Nausea And Vomiting   Wellbutrin [bupropion]    Headaches        Medication List        Accurate as of June 24, 2021 11:59 PM. If you have any questions, ask your nurse or doctor.          STOP taking these medications    predniSONE 10 MG (21) Tbpk tablet Commonly known as: STERAPRED UNI-PAK 21 TAB Stopped by: June 26, 2021 Braniya Farrugia, MD       TAKE these medications    desvenlafaxine 50 MG 24 hr tablet Commonly known as: Pristiq Take 1 tablet (50 mg total) by mouth daily. What changed:  medication strength how much to take additional instructions Changed by: Elige Radon Miley Lindon, MD   ibuprofen 800 MG tablet Commonly known as: ADVIL TAKE ONE TABLET EVERY 8 HOURS AS NEEDED   levocetirizine 5 MG tablet Commonly known as: XYZAL Take 1 tablet (5 mg total) by mouth every evening.   omeprazole 20 MG capsule Commonly known as: PRILOSEC TAKE ONE (1) CAPSULE EACH DAY   triamcinolone cream 0.1 % Commonly known as: KENALOG Apply 1 application topically 2 (two) times daily.         Objective:   BP 126/89   Pulse 69   Ht 5\' 5"  (1.651 m)   Wt 222 lb (100.7  kg)   LMP 07/02/2020   SpO2 95%   BMI 36.94 kg/m   Wt Readings from Last 3 Encounters:  06/26/21 223 lb (101.2 kg)  06/24/21 222 lb (100.7 kg)  04/30/21 225 lb (102.1 kg)    Physical Exam Vitals and nursing note reviewed.  Constitutional:      General: She is not in acute distress.    Appearance: She is well-developed. She is not diaphoretic.  Eyes:     Conjunctiva/sclera: Conjunctivae normal.  Cardiovascular:     Rate and Rhythm: Normal rate and regular rhythm.     Heart sounds: Normal heart sounds. No murmur heard. Pulmonary:     Effort: Pulmonary effort is normal. No respiratory distress.     Breath sounds: Normal breath sounds. No wheezing.  Musculoskeletal:        General: No  tenderness. Normal range of motion.  Skin:    General: Skin is warm and dry.     Findings: No rash.  Neurological:     Mental Status: She is alert and oriented to person, place, and time.     Coordination: Coordination normal.  Psychiatric:        Mood and Affect: Mood is anxious and depressed.        Behavior: Behavior normal.        Thought Content: Thought content does not include suicidal ideation. Thought content does not include suicidal plan.      Assessment & Plan:   Problem List Items Addressed This Visit       Digestive   GERD (gastroesophageal reflux disease)     Other   Depression, recurrent (HCC) - Primary   Relevant Medications   desvenlafaxine (PRISTIQ) 50 MG 24 hr tablet   Insomnia   Relevant Medications   desvenlafaxine (PRISTIQ) 50 MG 24 hr tablet    Feels like the Pristiq is helping but not as much as it was before and would like to increase the dose, will go up to 50 mg. Follow up plan: Return if symptoms worsen or fail to improve, for 2-3 month depression and anxiety, can do Pap smear and physical as well if she wants then or wait.  Counseling provided for all of the vaccine components No orders of the defined types were placed in this encounter.   Arville Care, MD [facility] 06/29/2021, 11:21 AM

## 2021-06-26 ENCOUNTER — Encounter: Payer: Self-pay | Admitting: Allergy & Immunology

## 2021-06-26 ENCOUNTER — Other Ambulatory Visit: Payer: Self-pay

## 2021-06-26 ENCOUNTER — Ambulatory Visit: Payer: No Typology Code available for payment source | Admitting: Allergy & Immunology

## 2021-06-26 VITALS — BP 132/80 | HR 63 | Temp 98.1°F | Resp 18 | Ht 65.0 in | Wt 223.0 lb

## 2021-06-26 DIAGNOSIS — J302 Other seasonal allergic rhinitis: Secondary | ICD-10-CM

## 2021-06-26 DIAGNOSIS — J3089 Other allergic rhinitis: Secondary | ICD-10-CM | POA: Diagnosis not present

## 2021-06-26 DIAGNOSIS — L2084 Intrinsic (allergic) eczema: Secondary | ICD-10-CM

## 2021-06-26 MED ORDER — DOXEPIN HCL 25 MG PO CAPS: 25.0000 mg | ORAL_CAPSULE | Freq: Every day | ORAL | 1 refills | Status: DC

## 2021-06-26 MED ORDER — TRIAMCINOLONE ACETONIDE 0.1 % EX OINT: 1.0000 "application " | TOPICAL_OINTMENT | Freq: Two times a day (BID) | CUTANEOUS | 1 refills | Status: DC | PRN

## 2021-06-26 MED ORDER — OPZELURA 1.5 % EX CREA: 1.0000 "application " | TOPICAL_CREAM | Freq: Two times a day (BID) | CUTANEOUS | 1 refills | Status: DC | PRN

## 2021-06-26 MED ORDER — PREDNISONE 10 MG PO TABS: ORAL_TABLET | ORAL | 0 refills | Status: DC

## 2021-06-26 MED ORDER — MONTELUKAST SODIUM 10 MG PO TABS: 10.0000 mg | ORAL_TABLET | Freq: Every day | ORAL | 1 refills | Status: DC

## 2021-06-26 NOTE — Patient Instructions (Addendum)
1. Seasonal and perennial allergic rhinitis - Testing today showed: grasses, trees, indoor molds, outdoor molds, dust mites, cat, and dog - Copy of test results provided.  - Avoidance measures provided. - Stop taking: Benadryl - Start taking: Zyrtec (cetirizine) 10mg  tablet TWICE daily and Singulair (montelukast) 10mg  daily - You can use an extra dose of the antihistamine, if needed, for breakthrough symptoms.  - Consider nasal saline rinses 1-2 times daily to remove allergens from the nasal cavities as well as help with mucous clearance (this is especially helpful to do before the nasal sprays are given) - Consider allergy shots as a means of long-term control. - Allergy shots "re-train" and "reset" the immune system to ignore environmental allergens and decrease the resulting immune response to those allergens (sneezing, itchy watery eyes, runny nose, nasal congestion, etc).    - Allergy shots improve symptoms in 75-85% of patients.  - We can discuss more at the next appointment if the medications are not working for you.  2. Intrinsic atopic dermatitis - I think this is eczema, although it is in spots more consistent with psoriasis. - In any case, we are going to start on a prednisone taper.  - We are also sending in triamcinolone ointment tub to use twice daily as needed.  - Add on Opzelura twice daily as needed (NOT a steroid, so it is safe to use on the face and sensitive areas).  - Sample of Opzelura and copay card provided.  - The antihistamines above should help with the itching. - Start doxpein 25mg  at night to help with controlling itching and help with sleep. - Information on Dupixent provided.   3. Return in about 12 days (around 07/08/2021) @ 8am.   Please inform Caroline Hopkins of any Emergency Department visits, hospitalizations, or changes in symptoms. Call before going to the ED for breathing or allergy symptoms since we might be able to fit you in for a sick visit. Feel free to  contact 07/10/2021 anytime with any questions, problems, or concerns.  It was a pleasure to meet you today!  Websites that have reliable patient information: 1. American Academy of Asthma, Allergy, and Immunology: www.aaaai.org 2. Food Allergy Research and Education (FARE): foodallergy.org 3. Mothers of Asthmatics: http://www.asthmacommunitynetwork.org 4. American College of Allergy, Asthma, and Immunology: www.acaai.org   COVID-19 Vaccine Information can be found at: [place] For questions related to vaccine distribution or appointments, please email [email or web address] or call [phone].   We realize that you might be concerned about having an allergic reaction to the COVID19 vaccines. To help with that concern, WE ARE OFFERING THE COVID19 VACCINES IN OUR OFFICE! Ask the front desk for dates!     "Like" [place] on Facebook and Instagram for our latest updates!      A healthy democracy works best when [email or web address] participate! Make sure you are registered to vote! If you have moved or changed any of your contact information, you will need to get this updated before voting!  In some cases, you MAY be able to register to vote online: [phone]   1. Control-Buffer 50% Glycerol Negative   2. Control-Histamine 1 mg/ml 2+   3. Albumin saline Negative   4. Bahia Negative   5. [place] Negative   6. Johnson Negative   7. Kentucky Blue Negative   8. Meadow Fescue Negative   9. Perennial Rye Negative   10. Sweet Vernal Negative   11. Timothy Negative   12. Cocklebur Negative   13. Burweed Marshelder  Negative   14. Ragweed, short Negative   15. Ragweed, Giant Negative   16. Plantain,  English Negative   17. Lamb's Quarters Negative   18. Sheep Sorrell Negative   19. Rough Pigweed Negative   20. Marsh Elder, Rough Negative   21. Mugwort, Common Negative   22. Ash mix Negative   23.  Birch mix Negative   24. Beech American Negative   25. Box, Elder Negative   26. Cedar, red Negative   27. Cottonwood, Guinea-Bissau Negative   28. Elm mix Negative   29. Hickory 4+   30. Maple mix Negative   31. Oak, [place] mix Negative   32. Pecan Pollen 4+   33. Pine mix Negative   34. Sycamore Eastern Negative   35. Walnut, Black Pollen Negative   36. Alternaria alternata Negative   37. Cladosporium Herbarum Negative   38. Aspergillus mix Negative   39. Penicillium mix Negative   40. Bipolaris sorokiniana (Helminthosporium) Negative   41. Drechslera spicifera (Curvularia) Negative   42. Mucor plumbeus Negative   43. Fusarium moniliforme Negative   44. Aureobasidium pullulans (pullulara) Negative   45. Rhizopus oryzae Negative   46. Botrytis cinera Negative   47. Epicoccum nigrum Negative   48. Phoma betae Negative   49. Candida Albicans Negative   50. Trichophyton mentagrophytes Negative   51. Mite, D Farinae  5,000 AU/ml 4+   52. Mite, D Pteronyssinus  5,000 AU/ml 4+   53. Cat Hair 10,000 BAU/ml 4+   54.  Dog Epithelia Negative   55. Mixed Feathers Negative   56. Horse Epithelia Negative   57. Cockroach, German Negative   58. Mouse Negative   59. Tobacco Leaf Negative     Control Negative   [place] Negative   Johnson Negative   7 Grass 1+   Ragweed mix Negative   Weed mix Negative   Mold 1 1+   Mold 2 3+   Mold 3 3+   Mold 4 3+   Dog 3+   Cockroach Negative     .[email or web address]  Control of Mold Allergen   Mold and fungi can grow on a variety of surfaces provided certain temperature and moisture conditions exist.  Outdoor molds grow on plants, decaying vegetation and soil.  The major outdoor mold, Alternaria and Cladosporium, are found in very high numbers during hot and dry conditions.  Generally, a late Summer - Fall peak is seen for common outdoor fungal spores.  Rain will temporarily lower outdoor mold spore count, but counts rise rapidly when the rainy  period ends.  The most important indoor molds are Aspergillus and Penicillium.  Dark, humid and poorly ventilated basements are ideal sites for mold growth.  The next most common sites of mold growth are the bathroom and the kitchen.  Outdoor (Seasonal) Mold Control  Positive outdoor molds via skin testing: Alternaria, Cladosporium, Bipolaris (Helminthsporium), Drechslera (Curvalaria), and Mucor  Use air conditioning and keep windows closed Avoid exposure to decaying vegetation. Avoid leaf raking. Avoid grain handling. Consider wearing a face mask if working in moldy areas.    Indoor (Perennial) Mold Control   Positive indoor molds via skin testing: Aspergillus, Penicillium, Fusarium, Aureobasidium (Pullulara), and Rhizopus  Maintain humidity below 50%. Clean washable surfaces with 5% bleach solution. Remove sources e.g. contaminated carpets.    Control of Dust Mite Allergen    Dust mites play a major role in allergic asthma and rhinitis.  They occur in environments with high humidity wherever  human skin is found.  Dust mites absorb humidity from the atmosphere (ie, they do not drink) and feed on organic matter (including shed human and animal skin).  Dust mites are a microscopic type of insect that you cannot see with the naked eye.  High levels of dust mites have been detected from mattresses, pillows, carpets, upholstered furniture, bed covers, clothes, soft toys and any woven material.  The principal allergen of the dust mite is found in its feces.  A gram of dust may contain 1,000 mites and 250,000 fecal particles.  Mite antigen is easily measured in the air during house cleaning activities.  Dust mites do not bite and do not cause harm to humans, other than by triggering allergies/asthma.    Ways to decrease your exposure to dust mites in your home:  Encase mattresses, box springs and pillows with a mite-impermeable barrier or cover   Wash sheets, blankets and drapes weekly in  hot water (130 F) with detergent and dry them in a dryer on the hot setting.  Have the room cleaned frequently with a vacuum cleaner and a damp dust-mop.  For carpeting or rugs, vacuuming with a vacuum cleaner equipped with a high-efficiency particulate air (HEPA) filter.  The dust mite allergic individual should not be in a room which is being cleaned and should wait 1 hour after cleaning before going into the room. Do not sleep on upholstered furniture (eg, couches).   If possible removing carpeting, upholstered furniture and drapery from the home is ideal.  Horizontal blinds should be eliminated in the rooms where the person spends the most time (bedroom, study, television room).  Washable vinyl, roller-type shades are optimal. Remove all non-washable stuffed toys from the bedroom.  Wash stuffed toys weekly like sheets and blankets above.   Reduce indoor humidity to less than 50%.  Inexpensive humidity monitors can be purchased at most hardware stores.  Do not use a humidifier as can make the problem worse and are not recommended.  Allergy Shots   Allergies are the result of a chain reaction that starts in the immune system. Your immune system controls how your body defends itself. For instance, if you have an allergy to pollen, your immune system identifies pollen as an invader or allergen. Your immune system overreacts by producing antibodies called Immunoglobulin E (IgE). These antibodies travel to cells that release chemicals, causing an allergic reaction.  The concept behind allergy immunotherapy, whether it is received in the form of shots or tablets, is that the immune system can be desensitized to specific allergens that trigger allergy symptoms. Although it requires time and patience, the payback can be long-term relief.  How Do Allergy Shots Work?  Allergy shots work much like a vaccine. Your body responds to injected amounts of a particular allergen given in increasing doses, eventually  developing a resistance and tolerance to it. Allergy shots can lead to decreased, minimal or no allergy symptoms.  There generally are two phases: build-up and maintenance. Build-up often ranges from three to six months and involves receiving injections with increasing amounts of the allergens. The shots are typically given once or twice a week, though more rapid build-up schedules are sometimes used.  The maintenance phase begins when the most effective dose is reached. This dose is different for each person, depending on how allergic you are and your response to the build-up injections. Once the maintenance dose is reached, there are longer periods between injections, typically two to four weeks.  Occasionally doctors give cortisone-type shots that can temporarily reduce allergy symptoms. These types of shots are different and should not be confused with allergy immunotherapy shots.  Who Can Be Treated with Allergy Shots?  Allergy shots may be a good treatment approach for people with allergic rhinitis (hay fever), allergic asthma, conjunctivitis (eye allergy) or stinging insect allergy.   Before deciding to begin allergy shots, you should consider:   The length of allergy season and the severity of your symptoms  Whether medications and/or changes to your environment can control your symptoms  Your desire to avoid long-term medication use  Time: allergy immunotherapy requires a major time commitment  Cost: may vary depending on your insurance coverage  Allergy shots for children age 13five and older are effective and often well tolerated. They might prevent the onset of new allergen sensitivities or the progression to asthma.  Allergy shots are not started on patients who are pregnant but can be continued on patients who become pregnant while receiving them. In some patients with other medical conditions or who take certain common medications, allergy shots may be of risk. It is important to  mention other medications you talk to your allergist.   When Will I Feel Better?  Some may experience decreased allergy symptoms during the build-up phase. For others, it may take as long as 12 months on the maintenance dose. If there is no improvement after a year of maintenance, your allergist will discuss other treatment options with you.  If you aren't responding to allergy shots, it may be because there is not enough dose of the allergen in your vaccine or there are missing allergens that were not identified during your allergy testing. Other reasons could be that there are high levels of the allergen in your environment or major exposure to non-allergic triggers like tobacco smoke.  What Is the Length of Treatment?  Once the maintenance dose is reached, allergy shots are generally continued for three to five years. The decision to stop should be discussed with your allergist at that time. Some people may experience a permanent reduction of allergy symptoms. Others may relapse and a longer course of allergy shots can be considered.  What Are the Possible Reactions?  The two types of adverse reactions that can occur with allergy shots are local and systemic. Common local reactions include very mild redness and swelling at the injection site, which can happen immediately or several hours after. A systemic reaction, which is less common, affects the entire body or a particular body system. They are usually mild and typically respond quickly to medications. Signs include increased allergy symptoms such as sneezing, a stuffy nose or hives.  Rarely, a serious systemic reaction called anaphylaxis can develop. Symptoms include swelling in the throat, wheezing, a feeling of tightness in the chest, nausea or dizziness. Most serious systemic reactions develop within 30 minutes of allergy shots. This is why it is strongly recommended you wait in your doctor's office for 30 minutes after your injections.  Your allergist is trained to watch for reactions, and his or her staff is trained and equipped with the proper medications to identify and treat them.  Who Should Administer Allergy Shots?  The preferred location for receiving shots is your prescribing allergist's office. Injections can sometimes be given at another facility where the physician and staff are trained to recognize and treat reactions, and have received instructions by your prescribing allergist.

## 2021-06-26 NOTE — Progress Notes (Signed)
NEW PATIENT  Date of Service/Encounter:  06/26/21  Consult requested by: Dettinger, Elige Radon, MD   Assessment:   Seasonal and perennial allergic rhinitis (grasses, trees, indoor molds, outdoor molds, dust mites, cat, and dog)  Intrinsic atopic dermatitis  Plan/Recommendations:    1. Seasonal and perennial allergic rhinitis - Testing today showed: grasses, trees, indoor molds, outdoor molds, dust mites, cat, and dog - Copy of test results provided.  - Avoidance measures provided. - Stop taking: Benadryl - Start taking: Zyrtec (cetirizine)  tablet TWICE daily and Singulair (montelukast)  daily - You can use an extra dose of the antihistamine, if needed, for breakthrough symptoms.  - Consider nasal saline rinses 1-2 times daily to remove allergens from the nasal cavities as well as help with mucous clearance (this is especially helpful to do before the nasal sprays are given) - Consider allergy shots as a means of long-term control. - Allergy shots "re-train" and "reset" the immune system to ignore environmental allergens and decrease the resulting immune response to those allergens (sneezing, itchy watery eyes, runny nose, nasal congestion, etc).    - Allergy shots improve symptoms in 75-85% of patients.  - We can discuss more at the next appointment if the medications are not working for you.  2. Intrinsic atopic dermatitis - I think this is eczema, although it is in spots more consistent with psoriasis. - In any case, we are going to start on a prednisone taper.  - We are also sending in triamcinolone ointment tub to use twice daily as needed.  - Add on Opzelura twice daily as needed (NOT a steroid, so it is safe to use on the face and sensitive areas).  - Sample of Opzelura and copay card provided.  - The antihistamines above should help with the itching. - Start doxpein  at night to help with controlling itching and help with sleep. - Information on Dupixent  provided.   3. Return in about 12 days (around 07/08/2021) @ 8am.  This note in its entirety was forwarded to the Provider who requested this consultation.  Subjective:   Caroline Hopkins is a 40 y.o. female presenting today for evaluation of  Chief Complaint  Patient presents with   Eczema    Caroline Hopkins has a history of the following: Patient Active Problem List   Diagnosis Date Noted   Insomnia 03/31/2018   IUD (intrauterine device) in place 04/27/2017   Menorrhagia with regular cycle 02/27/2016   Dysmenorrhea 02/27/2016   H/O vitamin D deficiency 02/27/2016   GERD (gastroesophageal reflux disease) 01/02/2016   Arnold-Chiari syndrome (HCC) 10/30/2015   Depression, recurrent (HCC) 10/30/2015   Obesity, unspecified 12/07/2007   IRRITABLE BOWEL SYNDROME 12/07/2007   FATTY LIVER DISEASE 12/07/2007   DEGENERATIVE JOINT DISEASE, ANKLE 12/07/2007   NEPHROLITHIASIS, HX OF 12/07/2007    History obtained from: chart review and patient.  Caroline Hopkins was referred by Dettinger, Elige Radon, MD.     [name] is a 40 y.o. female presenting for an evaluation of a rash .  She reports that she has had this rash for the past 5 years or so.  It tends to only occur in the spring and the summer but has become more prominent throughout the year as of late.  She has tried hydrocortisone as well as 2 different types of triamcinolone.  She has been on prednisone which cleared up completely.  She uses Aveeno for moisturizing as well as a few other brands.  Nothing seems to really work well.  She has never seen a dermatologist.  She has never had a biopsy performed.  Her rash does not worsen with exposure to any food.  She tolerates all of the food allergies without adverse event.  She has never been on tacrolimus or pimecrolimus for her rash.  She does have a history of seasonal allergies that are worse in the spring and the summer.  She will take Benadryl.  This does not seem to make her sleepy and  she often starts the day with 2-3 Benadryl.  She does not use any nose sprays.  She has never been on allergy shots.  She has been tested in the past around 20 years ago, likely at [facility] from description of the location.  She does not remember the results of the testing, but thinks that she was allergic to everything.  Otherwise, there is no history of other atopic diseases, including asthma, food allergies, drug allergies, stinging insect allergies, urticaria, or contact dermatitis. There is no significant infectious history. Vaccinations are up to date.    Past Medical History: Patient Active Problem List   Diagnosis Date Noted   Insomnia 03/31/2018   IUD (intrauterine device) in place 04/27/2017   Menorrhagia with regular cycle 02/27/2016   Dysmenorrhea 02/27/2016   H/O vitamin D deficiency 02/27/2016   GERD (gastroesophageal reflux disease) 01/02/2016   Arnold-Chiari syndrome (HCC) 10/30/2015   Depression, recurrent (HCC) 10/30/2015   Obesity, unspecified 12/07/2007   IRRITABLE BOWEL SYNDROME 12/07/2007   FATTY LIVER DISEASE 12/07/2007   DEGENERATIVE JOINT DISEASE, ANKLE 12/07/2007   NEPHROLITHIASIS, HX OF 12/07/2007    Medication List:  Allergies as of 06/26/2021       Reactions   Latex Anaphylaxis   Blisters    Rocephin [ceftriaxone] Anaphylaxis   Oxycodone Nausea And Vomiting   Wellbutrin [bupropion]    Headaches        Medication List        Accurate as of June 26, 2021  1:13 PM. If you have any questions, ask your nurse or doctor.          desvenlafaxine 50 MG 24 hr tablet Commonly known as: Pristiq Take 1 tablet (50 mg total) by mouth daily.   diphenhydrAMINE 25 MG tablet Commonly known as: BENADRYL Take 25 mg by mouth every 6 (six) hours as needed.   doxepin 25 MG capsule Commonly known as: SINEQUAN Take 1 capsule (25 mg total) by mouth at bedtime. Started by: Alfonse SpruceJoel Louis Drayden Lukas, MD   hydrocortisone cream 1 % Apply 1  application topically 2 (two) times daily.   ibuprofen 800 MG tablet Commonly known as: ADVIL TAKE ONE TABLET EVERY 8 HOURS AS NEEDED   levocetirizine 5 MG tablet Commonly known as: XYZAL Take 1 tablet (5 mg total) by mouth every evening.   montelukast 10 MG tablet Commonly known as: Singulair Take 1 tablet (10 mg total) by mouth at bedtime. Started by: Alfonse SpruceJoel Louis Latonya Nelon, MD   omeprazole 20 MG capsule Commonly known as: PRILOSEC TAKE ONE (1) CAPSULE EACH DAY   Opzelura 1.5 % Crea Generic drug: Ruxolitinib Phosphate Apply 1 application topically 2 (two) times daily as needed. Started by: Alfonse SpruceJoel Louis Lakendra Helling, MD   predniSONE 10 MG tablet Commonly known as: DELTASONE Take 3 tabs (30mg ) twice daily for 3 days, then 2 tabs (20mg ) twice daily for 3 days, then 1 tab (10mg ) twice daily for 3 days, then STOP. Started by: Hetty ElyJoel Louis  Dellis Anes, MD   triamcinolone cream 0.1 % Commonly known as: KENALOG Apply 1 application topically 2 (two) times daily. What changed: Another medication with the same name was added. Make sure you understand how and when to take each. Changed by: Alfonse Spruce, MD   triamcinolone ointment 0.1 % Commonly known as: KENALOG Apply 1 application topically 2 (two) times daily as needed. What changed: You were already taking a medication with the same name, and this prescription was added. Make sure you understand how and when to take each. Changed by: Alfonse Spruce, MD        Birth History: non-contributory  Developmental History: non-contributory  Past Surgical History: Past Surgical History:  Procedure Laterality Date   ANKLE SURGERY Left    CESAREAN SECTION  1999 and 2001   FRACTURE SURGERY     left ankle repair x3   ROBOTIC ASSISTED LAPAROSCOPIC HYSTERECTOMY AND SALPINGECTOMY Bilateral 09/29/2020   Procedure: XI ROBOTIC ASSISTED TOTAL LAPAROSCOPIC HYSTERECTOMY AND SALPINGECTOMY, IUD REMOVAL, LYSIS OF ADHESIONS;  Surgeon:  Theresia Majors, MD;  Location: [facility];  Service: Gynecology;  Laterality: Bilateral;   SUBOCCIPITAL CRANIECTOMY CERVICAL LAMINECTOMY  09/11/2012   Procedure: SUBOCCIPITAL CRANIECTOMY CERVICAL LAMINECTOMY/DURAPLASTY;  Surgeon: Clydene Fake, MD;  Location: [facility];  Service: Neurosurgery;  Laterality: N/A;  Chiari decompression/Suboccipital craniectomy with Cervical one laminectomy/dural patch graft   TUBAL LIGATION  11/09/1999     Family History: Family History  Problem Relation Age of Onset   Arthritis Mother    Cancer Mother        THYROID   Depression Mother    Arthritis Maternal Grandmother    Cancer Maternal Grandmother    Heart disease Maternal Grandmother    Hypertension Maternal Grandmother    Breast cancer Maternal Grandmother    Cancer Maternal Grandfather        KIDNEY   Cancer Paternal Grandmother        LUNG     Social History: Caroline Hopkins lives at home with her family.  They live in a house that is 41 years old.  There is hardwood in the main living areas and carpeting in the bedroom.  They have a heat pump for heating and cooling.  There is a dog inside of the home as well as the bedroom.  There are no dust mite covers on the pillows, but there are some on the bed.  There is tobacco exposure in the car in the form of cigarettes.  She currently works as an EMT specifically with teen psychiatry.  She has been there for 23 years.  She is exposed to fumes, chemicals, and dust in her work environment.  She does not use a HEPA filter.  She does not live near an interstate or industrial area.  She has smoked 1 pack/day for 8 years.    Review of Systems  Constitutional: Negative.  Negative for chills, fever, malaise/fatigue and weight loss.  HENT:  Positive for congestion. Negative for ear discharge and ear pain.   Eyes:  Negative for pain, discharge and redness.  Respiratory:  Negative for cough, sputum production, shortness of breath and wheezing.    Cardiovascular: Negative.  Negative for chest pain and palpitations.  Gastrointestinal:  Negative for abdominal pain, heartburn, nausea and vomiting.  Skin:  Positive for itching and rash.  Neurological:  Negative for dizziness and headaches.  Endo/Heme/Allergies:  Negative for environmental allergies. Does not bruise/bleed easily.      Objective:  Blood pressure 132/80, pulse 63, temperature 98.1 F (36.7 C), temperature source Temporal, resp. rate 18, height 5\' 5"  (1.651 m), weight 223 lb (101.2 kg), last menstrual period 07/02/2020, SpO2 97 %. Body mass index is 37.11 kg/m.   Physical Exam:   Physical Exam Constitutional:      Appearance: She is well-developed.     Comments: Pleasant.  HENT:     Head: Normocephalic and atraumatic.     Right Ear: Tympanic membrane, ear canal and external ear normal. No drainage, swelling or tenderness. Tympanic membrane is not injected, scarred, erythematous, retracted or bulging.     Left Ear: Tympanic membrane, ear canal and external ear normal. No drainage, swelling or tenderness. Tympanic membrane is not injected, scarred, erythematous, retracted or bulging.     Nose: Mucosal edema present. No nasal deformity, septal deviation or rhinorrhea.     Right Turbinates: Enlarged and swollen.     Left Turbinates: Enlarged and swollen.     Right Sinus: No maxillary sinus tenderness or frontal sinus tenderness.     Left Sinus: No maxillary sinus tenderness or frontal sinus tenderness.     Mouth/Throat:     Mouth: Mucous membranes are not pale and not dry.     Pharynx: Uvula midline.  Eyes:     General:        Right eye: No discharge.        Left eye: No discharge.     Conjunctiva/sclera: Conjunctivae normal.     Right eye: Right conjunctiva is not injected. No chemosis.    Left eye: Left conjunctiva is not injected. No chemosis.    Pupils: Pupils are equal, round, and reactive to light.  Cardiovascular:     Rate and Rhythm: Normal rate and  regular rhythm.     Heart sounds: Normal heart sounds.  Pulmonary:     Effort: Pulmonary effort is normal. No tachypnea, accessory muscle usage or respiratory distress.     Breath sounds: Normal breath sounds. No wheezing, rhonchi or rales.  Chest:     Chest wall: No tenderness.  Abdominal:     Tenderness: There is no abdominal tenderness. There is no guarding or rebound.  Lymphadenopathy:     Head:     Right side of head: No submandibular, tonsillar or occipital adenopathy.     Left side of head: No submandibular, tonsillar or occipital adenopathy.     Cervical: No cervical adenopathy.  Skin:    General: Skin is warm.     Capillary Refill: Capillary refill takes less than 2 seconds.     Coloration: Skin is not pale.     Findings: Rash present. No abrasion, erythema or petechiae. Rash is papular. Rash is not urticarial or vesicular.     Comments: Roughened papular rashes over the bilateral arms as well as the ankles and feet.  They are on the extensor surfaces, but they do not look psoriatic.  She does have excoriations.  There is minimal if any honey crusting and no oozing.  She does have some involvement of her neck as well.  There is some on her face as well as her scalp.  Neurological:     Mental Status: She is alert.  Psychiatric:        Behavior: Behavior is cooperative.     Diagnostic studies:   Allergy Studies:     Airborne Adult Perc - 06/26/21 0916     Time Antigen Placed 06/28/21    Allergen Manufacturer 0973  Location Back    Number of Test 59    Panel 1 Select    1. Control-Buffer 50% Glycerol Negative    2. Control-Histamine 1 mg/ml 2+    3. Albumin saline Negative    4. Bahia Negative    5. French Southern Territories Negative    6. Johnson Negative    7. Kentucky Blue Negative    8. Meadow Fescue Negative    9. Perennial Rye Negative    10. Sweet Vernal Negative    11. Timothy Negative    12. Cocklebur Negative    13. Burweed Marshelder Negative    14. Ragweed, short  Negative    15. Ragweed, Giant Negative    16. Plantain,  English Negative    17. Lamb's Quarters Negative    18. Sheep Sorrell Negative    19. Rough Pigweed Negative    20. Marsh Elder, Rough Negative    21. Mugwort, Common Negative    22. Ash mix Negative    23. Birch mix Negative    24. Beech American Negative    25. Box, Elder Negative    26. Cedar, red Negative    27. Cottonwood, Guinea-Bissau Negative    28. Elm mix Negative    29. Hickory 4+    30. Maple mix Negative    31. Oak, Guinea-Bissau mix Negative    32. Pecan Pollen 4+    33. Pine mix Negative    34. Sycamore Eastern Negative    35. Walnut, Black Pollen Negative    36. Alternaria alternata Negative    37. Cladosporium Herbarum Negative    38. Aspergillus mix Negative    39. Penicillium mix Negative    40. Bipolaris sorokiniana (Helminthosporium) Negative    41. Drechslera spicifera (Curvularia) Negative    42. Mucor plumbeus Negative    43. Fusarium moniliforme Negative    44. Aureobasidium pullulans (pullulara) Negative    45. Rhizopus oryzae Negative    46. Botrytis cinera Negative    47. Epicoccum nigrum Negative    48. Phoma betae Negative    49. Candida Albicans Negative    50. Trichophyton mentagrophytes Negative    51. Mite, D Farinae  5,000 AU/ml 4+    52. Mite, D Pteronyssinus  5,000 AU/ml 4+    53. Cat Hair 10,000 BAU/ml 4+    54.  Dog Epithelia Negative    55. Mixed Feathers Negative    56. Horse Epithelia Negative    57. Cockroach, German Negative    58. Mouse Negative    59. Tobacco Leaf Negative             Intradermal - 06/26/21 0952     Time Antigen Placed 8563    Allergen Manufacturer Waynette Buttery    Location Arm    Number of Test 12    Intradermal Select    Control Negative    French Southern Territories Negative    Johnson Negative    7 Grass 1+    Ragweed mix Negative    Weed mix Negative    Mold 1 1+    Mold 2 3+    Mold 3 3+    Mold 4 3+    Dog 3+    Cockroach Negative             Allergy  testing results were read and interpreted by myself, documented by clinical staff.         Malachi Bonds, MD [facility]

## 2021-07-08 ENCOUNTER — Other Ambulatory Visit: Payer: Self-pay

## 2021-07-08 ENCOUNTER — Ambulatory Visit: Payer: No Typology Code available for payment source | Admitting: Allergy & Immunology

## 2021-07-08 ENCOUNTER — Encounter: Payer: Self-pay | Admitting: Allergy & Immunology

## 2021-07-08 VITALS — BP 122/80 | HR 63 | Temp 98.7°F | Resp 18

## 2021-07-08 DIAGNOSIS — L2084 Intrinsic (allergic) eczema: Secondary | ICD-10-CM

## 2021-07-08 DIAGNOSIS — J3089 Other allergic rhinitis: Secondary | ICD-10-CM

## 2021-07-08 DIAGNOSIS — J302 Other seasonal allergic rhinitis: Secondary | ICD-10-CM

## 2021-07-08 MED ORDER — DOXEPIN HCL 6 MG PO TABS: 6.0000 mg | ORAL_TABLET | Freq: Every evening | ORAL | 5 refills | Status: DC | PRN

## 2021-07-08 MED ORDER — DOXEPIN HCL 10 MG PO CAPS: 10.0000 mg | ORAL_CAPSULE | Freq: Every day | ORAL | 5 refills | Status: DC

## 2021-07-08 NOTE — Patient Instructions (Addendum)
1. Seasonal and perennial allergic rhinitis (grasses, trees, indoor molds, outdoor molds, dust mites, cat, and dog) - Continue with: Zyrtec (cetirizine) 10mg  tablet TWICE daily (IF NEEDED) and Singulair (montelukast) 10mg  daily -  We can hold off on allergy shots for now since you are doing so well.  2. Intrinsic atopic dermatitis - Continue with Opzelura twice daily as needed (NOT a steroid, so it is safe to use on the face and sensitive areas).  - Continue with triamcinolone ointment twice daily as needed.  - We are decreasing your Doxpein to 10mg  since the others were so sedating.  - We can hold off on Dupixent for now.  - We have a lot of patients that do very well on this.  3. Return in about 6 weeks (around 08/19/2021).    Please inform of any Emergency Department visits, hospitalizations, or changes in symptoms. Call before going to the ED for breathing or allergy symptoms since we might be able to fit you in for a sick visit. Feel free to contact 10/19/2021 anytime with any questions, problems, or concerns.  It was a pleasure to see you again today!  Websites that have reliable patient information: 1. American Academy of Asthma, Allergy, and Immunology: www.aaaai.org 2. Food Allergy Research and Education (FARE): foodallergy.org 3. Mothers of Asthmatics: http://www.asthmacommunitynetwork.org 4. American College of Allergy, Asthma, and Immunology: www.acaai.org   COVID-19 Vaccine Information can be found at: [place] For questions related to vaccine distribution or appointments, please email [email or web address] or call [phone].   We realize that you might be concerned about having an allergic reaction to the COVID19 vaccines. To help with that concern, WE ARE OFFERING THE COVID19 VACCINES IN OUR OFFICE! Ask the front desk for dates!     "Like" [place] on Facebook and Instagram for our latest updates!       A healthy democracy works best when [email or web address] participate! Make sure you are registered to vote! If you have moved or changed any of your contact information, you will need to get this updated before voting!  In some cases, you MAY be able to register to vote online: [phone]

## 2021-07-08 NOTE — Progress Notes (Signed)
FOLLOW UP  Date of Service/Encounter:  07/08/21   Assessment:   Seasonal and perennial allergic rhinitis (grasses, trees, indoor molds, outdoor molds, dust mites, cat, and dog)   Intrinsic atopic dermatitis   Overall, Caroline Hopkins is doing much better with regards to control of her rash.  Her rhinitis symptoms are not serious at all and she denies any nasal symptoms at all.  The Opzelura in combination with the doxepin and the Singulair seem to be doing the trick with regards to controlling her itching.  We are getting continue with that same regimen for now.  I do not think she needs Dupixent at this point, although this might change in the future.  She does have the information to look this up and do more research on her own.  We are going to decrease the doxepin to a lower dose and she reports sedation.  Plan/Recommendations:   1. Seasonal and perennial allergic rhinitis (grasses, trees, indoor molds, outdoor molds, dust mites, cat, and dog) - Continue with: Zyrtec (cetirizine) 10mg  tablet TWICE daily (IF NEEDED) and Singulair (montelukast) 10mg  daily -  We can hold off on allergy shots for now since you are doing so well.  2. Intrinsic atopic dermatitis - Continue with Opzelura twice daily as needed (NOT a steroid, so it is safe to use on the face and sensitive areas).  - Continue with triamcinolone ointment twice daily as needed.  - We are decreasing your Doxpein to 10mg  since the others were so sedating.  - We can hold off on Dupixent for now.  - We have a lot of patients that do very well on this.  3. Return in about 6 weeks (around 08/19/2021).    Subjective:   Caroline Hopkins is a 40 y.o. female presenting today for follow up of  Chief Complaint  Patient presents with   Follow-up    Caroline Hopkins has a history of the following: Patient Active Problem List   Diagnosis Date Noted   Insomnia 03/31/2018   IUD (intrauterine device) in place 04/27/2017   Menorrhagia with  regular cycle 02/27/2016   Dysmenorrhea 02/27/2016   H/O vitamin D deficiency 02/27/2016   GERD (gastroesophageal reflux disease) 01/02/2016   Arnold-Chiari syndrome (HCC) 10/30/2015   Depression, recurrent (HCC) 10/30/2015   Obesity, unspecified 12/07/2007   IRRITABLE BOWEL SYNDROME 12/07/2007   FATTY LIVER DISEASE 12/07/2007   DEGENERATIVE JOINT DISEASE, ANKLE 12/07/2007   NEPHROLITHIASIS, HX OF 12/07/2007    History obtained from: chart review and patient.  Caroline Hopkins is a 40 y.o. female presenting for a follow up visit.  She was last seen in August 2022 around 2 weeks ago for valuation of atopic dermatitis.  He had testing that was positive to multiple indoor and outdoor allergens.  We stopped her Benadryl and started Zyrtec twice daily and Singulair 10 mg daily.  She was not interested in nasal sprays at all.  Her rash was very confusing with a mix of what I felt was eczema and psoriasis.  We sent in a prednisone taper.  We also started triamcinolone twice daily as needed.  We added on Opzelura and started doxepin 25 mg at night.  We gave her information on Dupixent.  Since last visit, she has mostly done well. The prednisone and the doxpein helped a lot. She is not having the itching at all which is good for her quality of life. She was digging in the middle of the night before and she is not  doing that now.   She tends to get bad when she is around water. No one else seems to have exacerbations of this. [place] three weekends in a row.   Otherwise, there have been no changes to her past medical history, surgical history, family history, or social history.    Review of Systems  Constitutional: Negative.  Negative for chills, fever, malaise/fatigue and weight loss.  HENT: Negative.  Negative for congestion, ear discharge and ear pain.   Eyes:  Negative for pain, discharge and redness.  Respiratory:  Negative for cough, sputum  production, shortness of breath and wheezing.   Cardiovascular: Negative.  Negative for chest pain and palpitations.  Gastrointestinal:  Negative for abdominal pain, constipation, diarrhea, heartburn, nausea and vomiting.  Skin:  Positive for rash. Negative for itching.  Neurological:  Negative for dizziness and headaches.  Endo/Heme/Allergies:  Negative for environmental allergies. Does not bruise/bleed easily.      Objective:   Blood pressure 122/80, pulse 63, temperature 98.7 F (37.1 C), temperature source Temporal, resp. rate 18, last menstrual period 07/02/2020, SpO2 97 %. There is no height or weight on file to calculate BMI.   Physical Exam:  Physical Exam Constitutional:      Appearance: She is well-developed.  HENT:     Head: Normocephalic and atraumatic.     Right Ear: Tympanic membrane, ear canal and external ear normal.     Left Ear: Tympanic membrane, ear canal and external ear normal.     Nose: No nasal deformity, septal deviation, mucosal edema or rhinorrhea.     Right Turbinates: Not enlarged or swollen.     Left Turbinates: Not enlarged or swollen.     Right Sinus: No maxillary sinus tenderness or frontal sinus tenderness.     Left Sinus: No maxillary sinus tenderness or frontal sinus tenderness.     Mouth/Throat:     Mouth: Mucous membranes are not pale and not dry.     Pharynx: Uvula midline.  Eyes:     General: Lids are normal. No allergic shiner.       Right eye: No discharge.        Left eye: No discharge.     Conjunctiva/sclera: Conjunctivae normal.     Right eye: Right conjunctiva is not injected. No chemosis.    Left eye: Left conjunctiva is not injected. No chemosis.    Pupils: Pupils are equal, round, and reactive to light.  Cardiovascular:     Rate and Rhythm: Normal rate and regular rhythm.     Heart sounds: Normal heart sounds.  Pulmonary:     Effort: Pulmonary effort is normal. No tachypnea, accessory muscle usage or respiratory distress.      Breath sounds: Normal breath sounds. No wheezing, rhonchi or rales.  Chest:     Chest wall: No tenderness.  Lymphadenopathy:     Cervical: No cervical adenopathy.  Skin:    General: Skin is warm.     Capillary Refill: Capillary refill takes less than 2 seconds.     Coloration: Skin is not pale.     Findings: Rash present. No abrasion, erythema or petechiae. Rash is not papular, urticarial or vesicular.     Comments: She does have some residual lesions in the bilateral arms, but they are smoother now.  There are no urticaria present.  She does have reddened areas on the extensor surfaces of her hand, although I  would not call it heliotrope in color.  Neurological:     Mental Status: She is alert.  Psychiatric:        Behavior: Behavior is cooperative.     Diagnostic studies: none      Malachi Bonds, MD  [facility]

## 2021-07-17 ENCOUNTER — Ambulatory Visit: Payer: No Typology Code available for payment source | Admitting: Allergy & Immunology

## 2021-08-07 ENCOUNTER — Other Ambulatory Visit: Payer: Self-pay | Admitting: Allergy & Immunology

## 2021-08-07 ENCOUNTER — Other Ambulatory Visit: Payer: Self-pay | Admitting: Nurse Practitioner

## 2021-08-26 ENCOUNTER — Ambulatory Visit: Payer: No Typology Code available for payment source | Admitting: Allergy & Immunology

## 2021-09-25 ENCOUNTER — Other Ambulatory Visit: Payer: Self-pay

## 2021-09-25 ENCOUNTER — Encounter: Payer: Self-pay | Admitting: Family Medicine

## 2021-09-25 ENCOUNTER — Ambulatory Visit (INDEPENDENT_AMBULATORY_CARE_PROVIDER_SITE_OTHER): Payer: No Typology Code available for payment source | Admitting: Family Medicine

## 2021-09-25 VITALS — BP 131/89 | HR 63 | Ht 65.0 in | Wt 237.0 lb

## 2021-09-25 DIAGNOSIS — Z0001 Encounter for general adult medical examination with abnormal findings: Secondary | ICD-10-CM | POA: Diagnosis not present

## 2021-09-25 DIAGNOSIS — Z0000 Encounter for general adult medical examination without abnormal findings: Secondary | ICD-10-CM

## 2021-09-25 DIAGNOSIS — F339 Major depressive disorder, recurrent, unspecified: Secondary | ICD-10-CM | POA: Diagnosis not present

## 2021-09-25 DIAGNOSIS — Z1231 Encounter for screening mammogram for malignant neoplasm of breast: Secondary | ICD-10-CM | POA: Diagnosis not present

## 2021-09-25 DIAGNOSIS — I10 Essential (primary) hypertension: Secondary | ICD-10-CM

## 2021-09-25 DIAGNOSIS — F5104 Psychophysiologic insomnia: Secondary | ICD-10-CM

## 2021-09-25 DIAGNOSIS — E8881 Metabolic syndrome: Secondary | ICD-10-CM

## 2021-09-25 DIAGNOSIS — Z23 Encounter for immunization: Secondary | ICD-10-CM

## 2021-09-25 MED ORDER — SEMAGLUTIDE-WEIGHT MANAGEMENT 1.7 MG/0.75ML ~~LOC~~ SOAJ: 1.7000 mg | SUBCUTANEOUS | 0 refills | Status: DC

## 2021-09-25 MED ORDER — SEMAGLUTIDE-WEIGHT MANAGEMENT 0.25 MG/0.5ML ~~LOC~~ SOAJ: 0.2500 mg | SUBCUTANEOUS | 0 refills | Status: DC

## 2021-09-25 MED ORDER — SEMAGLUTIDE-WEIGHT MANAGEMENT 0.5 MG/0.5ML ~~LOC~~ SOAJ
0.5000 mg | SUBCUTANEOUS | 0 refills | Status: DC
Start: 2021-10-24 — End: 2021-10-07

## 2021-09-25 MED ORDER — SEMAGLUTIDE-WEIGHT MANAGEMENT 1 MG/0.5ML ~~LOC~~ SOAJ: 1.0000 mg | SUBCUTANEOUS | 0 refills | Status: DC

## 2021-09-25 MED ORDER — DESVENLAFAXINE SUCCINATE ER 25 MG PO TB24: 25.0000 mg | ORAL_TABLET | Freq: Every day | ORAL | 2 refills | Status: DC

## 2021-09-25 MED ORDER — SEMAGLUTIDE-WEIGHT MANAGEMENT 2.4 MG/0.75ML ~~LOC~~ SOAJ: 2.4000 mg | SUBCUTANEOUS | 0 refills | Status: DC

## 2021-09-25 MED ORDER — DESVENLAFAXINE SUCCINATE ER 50 MG PO TB24: 50.0000 mg | ORAL_TABLET | Freq: Every day | ORAL | 3 refills | Status: DC

## 2021-09-25 NOTE — Addendum Note (Signed)
Addended by: Dorene Sorrow on: 09/25/2021 03:42 PM   Modules accepted: Orders

## 2021-09-25 NOTE — Progress Notes (Signed)
BP 131/89   Pulse 63   Ht _0  (1.651 m)   Wt 237 lb (107.5 kg)   LMP 07/02/2020   SpO2 99%   BMI 39.44 kg/m    Subjective:   Patient ID: Caroline Hopkins, female    DOB: [birth year], 40 y.o.   MRN: [id number]  HPI: TALLEY CASCO is a 40 y.o. female presenting on 09/25/2021 for Medical Management of Chronic Issues, Depression, and Anxiety   HPI Adult well exam and physical Patient is coming in for adult well exam and physical and recheck of chronic medical issues.  Patient says her anxiety and depression has improved but she still having some anxiety.  She is not crying as much but has improved.  She would like to go up on the Pristiq and see how it does for her.  She denies any suicidal ideations or thoughts of hurting self.  She is sleeping somewhat better.  A lot of this was brought on by her son's passing away earlier in the year.  Patient would like to discuss weight loss and obesity and how she is gaining weight and she just feels like is not doing as well and that is affecting her emotional wellbeing as well.  She says she is not eating that much and trying but would like to try something for weight loss.  Relevant past medical, surgical, family and social history reviewed and updated as indicated. Interim medical history since our last visit reviewed. Allergies and medications reviewed and updated.  Review of Systems  Constitutional:  Positive for appetite change and unexpected weight change. Negative for chills and fever.  HENT:  Negative for ear pain and tinnitus.   Eyes:  Negative for pain.  Respiratory:  Negative for cough, shortness of breath and wheezing.   Cardiovascular:  Negative for chest pain, palpitations and leg swelling.  Gastrointestinal:  Negative for abdominal pain, blood in stool, constipation and diarrhea.  Genitourinary:  Negative for dysuria and hematuria.  Musculoskeletal:  Negative for back pain and myalgias.  Skin:  Negative for rash.   Neurological:  Negative for dizziness, weakness and headaches.  Psychiatric/Behavioral:  Negative for suicidal ideas.    Per HPI unless specifically indicated above   Allergies as of 09/25/2021       Reactions   Latex Anaphylaxis   Blisters    Rocephin [ceftriaxone] Anaphylaxis   Oxycodone Nausea And Vomiting   Wellbutrin [bupropion]    Headaches        Medication List        Accurate as of September 25, 2021  3:39 PM. If you have any questions, ask your nurse or doctor.          STOP taking these medications    diphenhydrAMINE 25 MG tablet Commonly known as: BENADRYL Stopped by: Fransisca Kaufmann Adrienne Trombetta, MD   hydrocortisone cream 1 % Stopped by: Fransisca Kaufmann Srihari Shellhammer, MD       TAKE these medications    desvenlafaxine 50 MG 24 hr tablet Commonly known as: Pristiq Take 1 tablet (50 mg total) by mouth daily. Take 25 mg with 50 for total of 75 What changed: additional instructions Changed by: Fransisca Kaufmann Dorcus Riga, MD   desvenlafaxine 25 MG 24 hr tablet Commonly known as: Pristiq Take 1 tablet (25 mg total) by mouth daily. Take 25 mg with 50 for total of 75 What changed: You were already taking a medication with the same name, and this prescription was added. Make sure you  understand how and when to take each. Changed by: Fransisca Kaufmann Sheika Coutts, MD   doxepin 10 MG capsule Commonly known as: SINEQUAN Take 1 capsule (10 mg total) by mouth at bedtime.   ibuprofen 800 MG tablet Commonly known as: ADVIL TAKE ONE TABLET EVERY 8 HOURS AS NEEDED   montelukast 10 MG tablet Commonly known as: SINGULAIR TAKE ONE TABLET BY MOUTH AT BEDTIME   omeprazole 20 MG capsule Commonly known as: PRILOSEC TAKE ONE (1) CAPSULE EACH DAY   Opzelura 1.5 % Crea Generic drug: Ruxolitinib Phosphate Apply 1 application topically 2 (two) times daily as needed.   Semaglutide-Weight Management 0.25 MG/0.5ML Soaj Inject 0.25 mg into the skin once a week for 28 days. Started by: Worthy Rancher, MD   Semaglutide-Weight Management 0.5 MG/0.5ML Soaj Inject 0.5 mg into the skin once a week for 28 days. Start taking on: October 24, 2021 Started by: Fransisca Kaufmann Lacharles Altschuler, MD   Semaglutide-Weight Management 1 MG/0.5ML Soaj Inject 1 mg into the skin once a week for 28 days. Start taking on: November 22, 2021 Started by: Fransisca Kaufmann Astaria Nanez, MD   Semaglutide-Weight Management 1.7 MG/0.75ML Soaj Inject 1.7 mg into the skin once a week for 28 days. Start taking on: December 21, 2021 Started by: Fransisca Kaufmann Tyus Kallam, MD   Semaglutide-Weight Management 2.4 MG/0.75ML Soaj Inject 2.4 mg into the skin once a week for 28 days. Start taking on: January 19, 2022 Started by: Fransisca Kaufmann Kebron Pulse, MD   triamcinolone cream 0.1 % Commonly known as: KENALOG Apply 1 application topically 2 (two) times daily.         Objective:   BP 131/89   Pulse 63   Ht _0  (1.651 m)   Wt 237 lb (107.5 kg)   LMP 07/02/2020   SpO2 99%   BMI 39.44 kg/m   Wt Readings from Last 3 Encounters:  09/25/21 237 lb (107.5 kg)  06/26/21 223 lb (101.2 kg)  06/24/21 222 lb (100.7 kg)    Physical Exam Vitals and nursing note reviewed.  Constitutional:      General: She is not in acute distress.    Appearance: She is well-developed. She is not diaphoretic.  Eyes:     Conjunctiva/sclera: Conjunctivae normal.     Pupils: Pupils are equal, round, and reactive to light.  Cardiovascular:     Rate and Rhythm: Normal rate and regular rhythm.     Heart sounds: Normal heart sounds. No murmur heard. Pulmonary:     Effort: Pulmonary effort is normal. No respiratory distress.     Breath sounds: Normal breath sounds. No wheezing.  Musculoskeletal:        General: No tenderness. Normal range of motion.  Skin:    General: Skin is warm and dry.     Findings: No rash.  Neurological:     Mental Status: She is alert and oriented to person, place, and time.     Coordination: Coordination normal.  Psychiatric:         Mood and Affect: Mood is anxious and depressed.        Behavior: Behavior normal.        Thought Content: Thought content does not include suicidal ideation. Thought content does not include suicidal plan.      Assessment & Plan:   Problem List Items Addressed This Visit       Other   Depression, recurrent ([place])   Relevant Medications   desvenlafaxine (PRISTIQ) 50 MG 24 hr tablet  desvenlafaxine (PRISTIQ) 25 MG 24 hr tablet   Insomnia   Relevant Medications   desvenlafaxine (PRISTIQ) 50 MG 24 hr tablet   Other Relevant Orders   Vitamin B12   VITAMIN D 25 Hydroxy (Vit-D Deficiency, Fractures)   Other Visit Diagnoses     Physical exam    -  Primary   Relevant Orders   CBC with Differential/Platelet   CMP14+EGFR   Lipid panel   Vitamin B12   VITAMIN D 25 Hydroxy (Vit-D Deficiency, Fractures)   Need for immunization against influenza       Relevant Orders   Flu Vaccine QUAD 173moIM (Fluarix, Fluzone & Alfiuria Quad PF) (Completed)   Encounter for screening mammogram for malignant neoplasm of breast       Relevant Orders   MM 3D SCREEN BREAST BILATERAL   Morbid [place] ([place]       With associated depression and metabolic syndrome   Relevant Medications   Semaglutide-Weight Management 0.25 MG/0.5ML SOAJ   Semaglutide-Weight Management 0.5 MG/0.5ML SOAJ (Start on 10/24/2021)   Semaglutide-Weight Management 1 MG/0.5ML SOAJ (Start on 11/22/2021)   Semaglutide-Weight Management 1.7 MG/0.75ML SOAJ (Start on 12/21/2021)   Semaglutide-Weight Management 2.4 MG/0.75ML SOAJ (Start on 32/44/9753   Metabolic syndrome       Relevant Medications   Semaglutide-Weight Management 0.25 MG/0.5ML SOAJ   Semaglutide-Weight Management 0.5 MG/0.5ML SOAJ (Start on 10/24/2021)   Semaglutide-Weight Management 1 MG/0.5ML SOAJ (Start on 11/22/2021)   Semaglutide-Weight Management 1.7 MG/0.75ML SOAJ (Start on 12/21/2021)   Semaglutide-Weight Management 2.4 MG/0.75ML SOAJ (Start on 01/19/2022)    Primary hypertension         Patient has metabolic syndrome based on centripetal obesity and stage II hypertension  Blood pressure slightly elevated but not enough to start medicine today.  Will start Wegovy to see if it helps with weight loss  Follow up plan: Return if symptoms worsen or fail to improve, for 1 to 262-monthepression and anxiety recheck.  Counseling provided for all of the vaccine components Orders Placed This Encounter  Procedures   MM 3D SCREEN BREAST BILATERAL   Flu Vaccine QUAD 73m78mo (Fluarix, Fluzone & Alfiuria Quad PF)   CBC with Differential/Platelet   CMP14+EGFR   Lipid panel   Vitamin B12   VITAMIN D 25 Hydroxy (Vit-D Deficiency, Fractures)    JosCaryl PinaD [facility] 09/25/2021, 3:39 PM

## 2021-09-26 LAB — CMP14+EGFR
ALT: 16 IU/L (ref 0–32)
AST: 16 IU/L (ref 0–40)
Albumin/Globulin Ratio: 1.5 (ref 1.2–2.2)
Albumin: 4.3 g/dL (ref 3.8–4.8)
Alkaline Phosphatase: 56 IU/L (ref 44–121)
BUN/Creatinine Ratio: 16 (ref 9–23)
BUN: 9 mg/dL (ref 6–24)
Bilirubin Total: 0.3 mg/dL (ref 0.0–1.2)
CO2: 25 mmol/L (ref 20–29)
Calcium: 9.8 mg/dL (ref 8.7–10.2)
Chloride: 103 mmol/L (ref 96–106)
Creatinine, Ser: 0.57 mg/dL (ref 0.57–1.00)
Globulin, Total: 2.8 g/dL (ref 1.5–4.5)
Glucose: 78 mg/dL (ref 70–99)
Potassium: 4.2 mmol/L (ref 3.5–5.2)
Sodium: 143 mmol/L (ref 134–144)
Total Protein: 7.1 g/dL (ref 6.0–8.5)
eGFR: 117 mL/min/{1.73_m2} (ref >59)

## 2021-09-26 LAB — CBC WITH DIFFERENTIAL/PLATELET
Basophils Absolute: 0.1 10*3/uL (ref 0.0–0.2)
Basos: 1 %
EOS (ABSOLUTE): 0.5 10*3/uL — ABNORMAL HIGH (ref 0.0–0.4)
Eos: 5 %
Hematocrit: 40.6 % (ref 34.0–46.6)
Hemoglobin: 14 g/dL (ref 11.1–15.9)
Immature Grans (Abs): 0 10*3/uL (ref 0.0–0.1)
Immature Granulocytes: 0 %
Lymphocytes Absolute: 2.5 10*3/uL (ref 0.7–3.1)
Lymphs: 27 %
MCH: 31.2 pg (ref 26.6–33.0)
MCHC: 34.5 g/dL (ref 31.5–35.7)
MCV: 90 fL (ref 79–97)
Monocytes Absolute: 0.4 10*3/uL (ref 0.1–0.9)
Monocytes: 5 %
Neutrophils Absolute: 5.6 10*3/uL (ref 1.4–7.0)
Neutrophils: 62 %
Platelets: 208 10*3/uL (ref 150–450)
RBC: 4.49 x10E6/uL (ref 3.77–5.28)
RDW: 12.6 % (ref 11.7–15.4)
WBC: 9.1 10*3/uL (ref 3.4–10.8)

## 2021-09-26 LAB — VITAMIN B12: Vitamin B-12: 329 pg/mL (ref 232–1245)

## 2021-09-26 LAB — LIPID PANEL
Chol/HDL Ratio: 3.3 ratio (ref 0.0–4.4)
Cholesterol, Total: 163 mg/dL (ref 100–199)
HDL: 49 mg/dL (ref >39)
LDL Chol Calc (NIH): 92 mg/dL (ref 0–99)
Triglycerides: 122 mg/dL (ref 0–149)
VLDL Cholesterol Cal: 22 mg/dL (ref 5–40)

## 2021-09-26 LAB — VITAMIN D 25 HYDROXY (VIT D DEFICIENCY, FRACTURES): Vit D, 25-Hydroxy: 27.1 ng/mL — ABNORMAL LOW (ref 30.0–100.0)

## 2021-09-26 LAB — FOLATE: Folate: 6.4 ng/mL (ref >3.0)

## 2021-09-29 ENCOUNTER — Telehealth: Payer: Self-pay

## 2021-09-29 DIAGNOSIS — E8881 Metabolic syndrome: Secondary | ICD-10-CM

## 2021-09-29 NOTE — Telephone Encounter (Signed)
PA for [facility] started in Covermymeds  Key [id number] Your demographic data has been sent to [employer] successfully!  [employer] typically takes 5-10 minutes to respond, but it may take a little longer in some cases. You will be notified by email when available. You can also check for an update later by opening this request from your dashboard. Please do not fax or call [employer] to resubmit this request. If you need assistance, please chat with CoverMyMeds or call us at [phone].  If it has been longer than 24 hours, please reach out to [employer].

## 2021-09-30 NOTE — Telephone Encounter (Signed)
Wait for Determination  Please wait for [employer] NCPDP [id number] to return a determination.

## 2021-10-05 NOTE — Addendum Note (Signed)
Addended by: Dorene Sorrow on: 10/05/2021 04:26 PM   Modules accepted: Orders

## 2021-10-05 NOTE — Telephone Encounter (Signed)
You or your doctor recently requested prescription coverage for Wegovy 0.25MG /0.5ML [place] SOAJ. After careful consideration and review of your prescription plan's drug list and the information sent to [place], this request was not approved. This is the initial adverse coverage determination for this request. We understand that this decision may not be what you and your doctor expected. This letter and the enclosed information will explain your options and help you decide what to do next. Why your request was denied: *Drug Not Covered/Plan Exclusion - Your request for coverage was denied because your prescription benefit plan does not cover the requested medication. *Drug Not Covered/Plan Exclusion - Your request for coverage was denied because your prescription benefit plan does not cover the requested medication. This decision relates specifically to coverage provided under your prescription benefit plan and does not involve any determination of medical judgment. We reviewed all the supporting information sent to [place] and used your plan's guidelines to make our decision. If you'd like [place] to send you a free copy of the guidelines (requirements, criteria, or protocols) we used, call the number on your prescription ID card. For more information about your prescription benefit, please refer to the prescription benefit section in your benefit plan materials. We've let your doctor know about this denial, so they will be ready to work with you on your next steps or an alternative treatment plan.

## 2021-10-05 NOTE — Telephone Encounter (Signed)
Please let the patient know that Caroline Hopkins was not covered by her insurance plan, if she wanted to try for Saxenda the daily injectable and we could try that 1.  Let me know

## 2021-10-05 NOTE — Telephone Encounter (Signed)
Pt is fine to try Saxenda. Pt made aware that a PA will probably be required.

## 2021-10-07 MED ORDER — SAXENDA 18 MG/3ML ~~LOC~~ SOPN: PEN_INJECTOR | SUBCUTANEOUS | 1 refills | Status: AC

## 2021-10-07 NOTE — Telephone Encounter (Signed)
I sent Saxenda to the pharmacy for her, we will see if it is covered

## 2021-10-07 NOTE — Addendum Note (Signed)
Addended by: Arville Care on: 10/07/2021 08:45 AM   Modules accepted: Orders

## 2021-10-13 ENCOUNTER — Telehealth: Payer: Self-pay

## 2021-10-13 NOTE — Telephone Encounter (Signed)
(  Key: [id number])  Your demographic data has been sent to [employer] successfully!  [employer] typically takes 5-10 minutes to respond, but it may take a little longer in some cases. You will be notified by email when available. You can also check for an update later by opening this request from your dashboard. Please do not fax or call [employer] to resubmit this request. If you need assistance, please chat with CoverMyMeds or call us at [phone].  If it has been longer than 24 hours, please reach out to [employer].  Dx: Obesity and BMI over 30

## 2021-10-16 ENCOUNTER — Other Ambulatory Visit: Payer: Self-pay | Admitting: Allergy & Immunology

## 2021-10-20 NOTE — Telephone Encounter (Signed)
Your prior authorization for Caroline Hopkins has been denied. RETURN TO DASHBOARD When applicable, information about how to complete an appeal for this patient will be sent to you. Please also see the determination letter provided by the payer/PBM for more information.  Message from plan: Your PA request has been denied. Additional information will be provided in the denial communication. (Message 1140)

## 2021-10-21 NOTE — Telephone Encounter (Signed)
Pt has been informed on vmail. Asked pt to call back if she would like to schedule an appt for next year to discuss options.

## 2021-10-21 NOTE — Telephone Encounter (Signed)
Please let the patient know that her Saxenda prior authorization was denied by her insurance, at the beginning of the new year if she wants to come in sometime for a visit we could try doing this 1 or other weight loss medications again.  Sometimes things change at the new year

## 2021-11-12 ENCOUNTER — Ambulatory Visit: Payer: No Typology Code available for payment source | Admitting: Family Medicine

## 2021-11-12 ENCOUNTER — Ambulatory Visit (INDEPENDENT_AMBULATORY_CARE_PROVIDER_SITE_OTHER): Payer: No Typology Code available for payment source

## 2021-11-12 ENCOUNTER — Encounter: Payer: Self-pay | Admitting: Family Medicine

## 2021-11-12 VITALS — BP 139/93 | Ht 65.0 in | Wt 235.0 lb

## 2021-11-12 DIAGNOSIS — M25531 Pain in right wrist: Secondary | ICD-10-CM

## 2021-11-12 DIAGNOSIS — F339 Major depressive disorder, recurrent, unspecified: Secondary | ICD-10-CM | POA: Diagnosis not present

## 2021-11-12 DIAGNOSIS — Z6839 Body mass index (BMI) 39.0-39.9, adult: Secondary | ICD-10-CM | POA: Diagnosis not present

## 2021-11-12 MED ORDER — DESVENLAFAXINE SUCCINATE ER 100 MG PO TB24: 100.0000 mg | ORAL_TABLET | Freq: Every day | ORAL | 3 refills | Status: DC

## 2021-11-12 MED ORDER — PHENTERMINE HCL 30 MG PO CAPS: 30.0000 mg | ORAL_CAPSULE | ORAL | 0 refills | Status: DC

## 2021-11-12 NOTE — Progress Notes (Signed)
BP (!) 139/93    Ht 5\' 5"  (1.651 m)    Wt 235 lb (106.6 kg)    LMP 07/02/2020    SpO2 97%    BMI 39.11 kg/m    Subjective:   Patient ID: Caroline Hopkins, female    DOB: Feb 20, 1981, 41 y.o.   MRN: [id number]  HPI: Caroline Hopkins is a 41 y.o. female presenting on 11/12/2021 for Medical Management of Chronic Issues, Anxiety, and Depression   HPI Anxiety and depression recheck Anxiety depression recheck today.  Patient has been taking Pristiq and was taking 75 mg and felt like it was doing very well but then she went on vacation and forgot the 25 mg and started taking 100 mg and felt like it did even better and she would like to go ahead and go with the 100 mg tablets.  She did not have any side effects.  She denies any suicidal ideations and does feel like it helped a lot with her anxiety and depression.  She said this is the first time she was able to make it through the holiday season without severe depression. Depression screen [facility] 2/9 11/12/2021 09/25/2021 06/24/2021 04/30/2021 04/17/2021  Decreased Interest 0 0 0 0 0  Down, Depressed, Hopeless 0 0 0 0 0  PHQ - 2 Score 0 0 0 0 0  Altered sleeping 0 0 2 0 -  Tired, decreased energy 1 1 2  0 -  Change in appetite 0 0 0 0 -  Feeling bad or failure about yourself  0 0 0 0 -  Trouble concentrating 0 0 0 0 -  Moving slowly or fidgety/restless 0 0 0 0 -  Suicidal thoughts 0 0 0 0 -  PHQ-9 Score 1 - 4 0 -  Difficult doing work/chores - - - Not difficult at all -  Some recent data might be hidden    Patient is coming in today with right wrist pain that she said started about 3 days ago.  She had been lifting and moving a lot of boxes does admit that she did have 1 fall on the wrist but does not think that that caused any major pain but at 1 point she heard a pop in her wrist and its been swollen and painful in her wrist and hurts with range of motion in all directions since that time.  She denies any numbness or weakness in that wrist.  Patient has  been having trouble with obesity and weight loss.  We have tried a couple different weight loss medicines but her insurance did not cover it and she would like to try the phentermine for short course.  She will keep close eye on her blood pressure.  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  Relevant past medical, surgical, family and social history reviewed and updated as indicated. Interim medical history since our last visit reviewed. Allergies and medications reviewed and updated.  Review of Systems  Constitutional:  Negative for chills and fever.  Eyes:  Negative for visual disturbance.  Respiratory:  Negative for chest tightness and shortness of breath.   Cardiovascular:  Negative for chest pain and leg swelling.  Musculoskeletal:  Positive for arthralgias. Negative for back pain and gait problem.  Skin:  Negative for rash.  Neurological:  Negative for light-headedness and headaches.  Psychiatric/Behavioral:  Negative for agitation and behavioral problems.   All other systems reviewed and are negative.  Per HPI unless  specifically indicated above   Allergies as of 11/12/2021       Reactions   Latex Anaphylaxis   Blisters    Rocephin [ceftriaxone] Anaphylaxis   Oxycodone Nausea And Vomiting   Wellbutrin [bupropion]    Headaches        Medication List        Accurate as of November 12, 2021  4:11 PM. If you have any questions, ask your nurse or doctor.          desvenlafaxine 100 MG 24 hr tablet Commonly known as: Pristiq Take 1 tablet (100 mg total) by mouth daily. What changed:  medication strength how much to take additional instructions Another medication with the same name was removed. Continue taking this medication, and follow the directions you see here. Changed by: Fransisca Kaufmann Raymie Trani, MD   doxepin 10 MG capsule Commonly known as: SINEQUAN Take 1 capsule (10 mg total) by mouth at bedtime.   ibuprofen 800 MG tablet Commonly known as:  ADVIL TAKE ONE TABLET EVERY 8 HOURS AS NEEDED   montelukast 10 MG tablet Commonly known as: SINGULAIR TAKE ONE TABLET BY MOUTH AT BEDTIME   omeprazole 20 MG capsule Commonly known as: PRILOSEC TAKE ONE (1) CAPSULE EACH DAY   Opzelura 1.5 % Crea Generic drug: Ruxolitinib Phosphate Apply 1 application topically 2 (two) times daily as needed.   phentermine 30 MG capsule Take 1 capsule (30 mg total) by mouth every morning. Started by: Worthy Rancher, MD   Saxenda 18 MG/3ML Sopn Generic drug: Liraglutide -Weight Management Inject 0.6 mg into the skin daily in the afternoon for 7 days, THEN 1.2 mg daily in the afternoon for 7 days, THEN 1.8 mg daily in the afternoon for 7 days, THEN 2.4 mg daily in the afternoon for 7 days, THEN 3 mg daily in the afternoon. Start taking on: October 07, 2021   Semaglutide-Weight Management 1.7 MG/0.75ML Soaj Inject 1.7 mg into the skin once a week for 28 days. Start taking on: December 21, 2021   Semaglutide-Weight Management 2.4 MG/0.75ML Soaj Inject 2.4 mg into the skin once a week for 28 days. Start taking on: January 19, 2022   triamcinolone cream 0.1 % Commonly known as: KENALOG Apply 1 application topically 2 (two) times daily.         Objective:   BP (!) 139/93    Ht 5\' 5"  (1.651 m)    Wt 235 lb (106.6 kg)    LMP 07/02/2020    SpO2 97%    BMI 39.11 kg/m   Wt Readings from Last 3 Encounters:  11/12/21 235 lb (106.6 kg)  09/25/21 237 lb (107.5 kg)  06/26/21 223 lb (101.2 kg)    Physical Exam Vitals and nursing note reviewed.  Constitutional:      General: She is not in acute distress.    Appearance: She is well-developed. She is not diaphoretic.  Eyes:     Conjunctiva/sclera: Conjunctivae normal.  Cardiovascular:     Rate and Rhythm: Normal rate and regular rhythm.     Heart sounds: Normal heart sounds. No murmur heard. Pulmonary:     Effort: Pulmonary effort is normal. No respiratory distress.     Breath sounds: Normal  breath sounds. No wheezing.  Musculoskeletal:        General: Normal range of motion.     Right wrist: Tenderness and crepitus present. No swelling, deformity, bony tenderness or snuff box tenderness. Normal range of motion.  Skin:  General: Skin is warm and dry.     Findings: No rash.  Neurological:     Mental Status: She is alert and oriented to person, place, and time.     Coordination: Coordination normal.  Psychiatric:        Behavior: Behavior normal.    Right wrist x-ray: No signs of acute bony abnormalities, await final read from radiology.  Assessment & Plan:   Problem List Items Addressed This Visit       Other   Obesity, unspecified   Relevant Medications   phentermine 30 MG capsule   Depression, recurrent (HCC)   Relevant Medications   desvenlafaxine (PRISTIQ) 100 MG 24 hr tablet   Other Visit Diagnoses     Right wrist pain    -  Primary   Relevant Orders   DG Wrist Complete Right     Wrist is likely tendinitis, recommend anti-inflammatories and continued gentle range of motion and movement and it should improve gradually over time.  Sent prescription for the increased dose of Pristiq at 100 and milligrams.  Sent phentermine and will do a short course of phentermine over the next 2 to 3 months.  Follow up plan: Return in about 4 weeks (around 12/10/2021), or if symptoms worsen or fail to improve, for Obesity recheck.  Counseling provided for all of the vaccine components Orders Placed This Encounter  Procedures   DG Wrist Complete Right    Caryl Pina, MD [facility] 11/12/2021, 4:11 PM

## 2021-11-18 ENCOUNTER — Ambulatory Visit
Admission: RE | Admit: 2021-11-18 | Discharge: 2021-11-18 | Disposition: A | Discharge status: Home / Self Care | Payer: No Typology Code available for payment source | Source: Ambulatory Visit | Attending: Family Medicine | Admitting: Family Medicine

## 2021-11-18 DIAGNOSIS — Z1231 Encounter for screening mammogram for malignant neoplasm of breast: Secondary | ICD-10-CM

## 2021-12-14 ENCOUNTER — Ambulatory Visit: Payer: No Typology Code available for payment source | Admitting: Family Medicine

## 2021-12-14 ENCOUNTER — Encounter: Payer: Self-pay | Admitting: Family Medicine

## 2021-12-14 VITALS — BP 123/91 | HR 71 | Ht 65.0 in | Wt 232.0 lb

## 2021-12-14 DIAGNOSIS — Z6839 Body mass index (BMI) 39.0-39.9, adult: Secondary | ICD-10-CM

## 2021-12-14 MED ORDER — PHENTERMINE HCL 37.5 MG PO CAPS: 37.5000 mg | ORAL_CAPSULE | ORAL | 0 refills | Status: DC

## 2021-12-14 NOTE — Progress Notes (Addendum)
BP (!) 123/91    Pulse 71    Ht 5\' 5"  (1.651 m)    Wt 235 lb (106.6 kg)    LMP 07/02/2020    SpO2 98%    BMI 39.11 kg/m    Subjective:   Patient ID: Caroline Hopkins, female    DOB: 02/02/1981, 41 y.o.   MRN: [id number]  HPI: Caroline Hopkins is a 41 y.o. female presenting on 12/14/2021 for Medical Management of Chronic Issues and Obesity   HPI Obesity and weight check Patient is coming in today for obesity and weight check.  She feels like she is done very well.  She says her home scale she felt like shoulder at 215 that here in the office that shows her at 232 pounds which is down a few pounds from last time.  We will increase the phentermine and continue the dose.   Relevant past medical, surgical, family and social history reviewed and updated as indicated. Interim medical history since our last visit reviewed. Allergies and medications reviewed and updated.  Review of Systems  Constitutional:  Negative for chills and fever.  Eyes:  Negative for visual disturbance.  Respiratory:  Negative for chest tightness and shortness of breath.   Cardiovascular:  Negative for chest pain and leg swelling.  Musculoskeletal:  Negative for back pain and gait problem.  Skin:  Negative for rash.  Neurological:  Negative for dizziness, light-headedness and headaches.  Psychiatric/Behavioral:  Negative for agitation and behavioral problems.   All other systems reviewed and are negative.  Per HPI unless specifically indicated above   Allergies as of 12/14/2021       Reactions   Latex Anaphylaxis   Blisters    Rocephin [ceftriaxone] Anaphylaxis   Oxycodone Nausea And Vomiting   Wellbutrin [bupropion]    Headaches        Medication List        Accurate as of December 14, 2021  4:10 PM. If you have any questions, ask your nurse or doctor.          STOP taking these medications    doxepin 10 MG capsule Commonly known as: SINEQUAN Stopped by: Worthy Rancher, MD    Semaglutide-Weight Management 1.7 MG/0.75ML Soaj Stopped by: Worthy Rancher, MD   Semaglutide-Weight Management 2.4 MG/0.75ML Soaj Stopped by: Fransisca Kaufmann Zareena Willis, MD       TAKE these medications    desvenlafaxine 100 MG 24 hr tablet Commonly known as: Pristiq Take 1 tablet (100 mg total) by mouth daily.   ibuprofen 800 MG tablet Commonly known as: ADVIL TAKE ONE TABLET EVERY 8 HOURS AS NEEDED   montelukast 10 MG tablet Commonly known as: SINGULAIR TAKE ONE TABLET BY MOUTH AT BEDTIME   omeprazole 20 MG capsule Commonly known as: PRILOSEC TAKE ONE (1) CAPSULE EACH DAY   Opzelura 1.5 % Crea Generic drug: Ruxolitinib Phosphate Apply 1 application topically 2 (two) times daily as needed.   phentermine 37.5 MG capsule Take 1 capsule (37.5 mg total) by mouth every morning. What changed:  medication strength how much to take Changed by: Fransisca Kaufmann Adynn Caseres, MD   triamcinolone cream 0.1 % Commonly known as: KENALOG Apply 1 application topically 2 (two) times daily.         Objective:   BP (!) 123/91    Pulse 71    Ht 5\' 5"  (1.651 m)    Wt 235 lb (106.6 kg)    LMP 07/02/2020    SpO2  98%    BMI 39.11 kg/m   Wt Readings from Last 3 Encounters:  12/14/21 235 lb (106.6 kg)  11/12/21 235 lb (106.6 kg)  09/25/21 237 lb (107.5 kg)    Physical Exam Vitals and nursing note reviewed.  Constitutional:      General: She is not in acute distress.    Appearance: She is well-developed. She is not diaphoretic.  Eyes:     Conjunctiva/sclera: Conjunctivae normal.  Skin:    General: Skin is warm and dry.     Findings: No rash.  Neurological:     Mental Status: She is alert and oriented to person, place, and time.     Coordination: Coordination normal.  Psychiatric:        Behavior: Behavior normal.      Assessment & Plan:   Problem List Items Addressed This Visit       Other   Obesity, unspecified - Primary   Relevant Medications   phentermine 37.5 MG  capsule    Will increase phentermine to 37 and see back in 4 weeks.  Continue with diet changes Follow up plan: Return in about 4 weeks (around 01/11/2022), or if symptoms worsen or fail to improve, for Obesity recheck.  Counseling provided for all of the vaccine components No orders of the defined types were placed in this encounter.   Caryl Pina, MD [facility] 12/14/2021, 4:10 PM

## 2022-01-15 ENCOUNTER — Other Ambulatory Visit: Payer: Self-pay | Admitting: Family Medicine

## 2022-01-18 ENCOUNTER — Telehealth: Payer: Self-pay | Admitting: Family Medicine

## 2022-01-18 ENCOUNTER — Encounter: Payer: Self-pay | Admitting: Family Medicine

## 2022-01-18 ENCOUNTER — Ambulatory Visit: Payer: No Typology Code available for payment source | Admitting: Family Medicine

## 2022-01-18 VITALS — BP 120/87 | HR 98 | Ht 65.0 in | Wt 236.0 lb

## 2022-01-18 DIAGNOSIS — Z6839 Body mass index (BMI) 39.0-39.9, adult: Secondary | ICD-10-CM | POA: Diagnosis not present

## 2022-01-18 MED ORDER — PHENTERMINE HCL 30 MG PO CAPS: 30.0000 mg | ORAL_CAPSULE | ORAL | 0 refills | Status: DC

## 2022-01-18 NOTE — Telephone Encounter (Signed)
Pt needs to come back in 4 wks to see PCP but PCP doesn't have any openings in 4 wks. Please call patient to schedule appt. Pt says she needs late afternoon appt.  ?

## 2022-01-18 NOTE — Telephone Encounter (Signed)
Appt made for 4/19 at 4:10. Pt made aware. ?

## 2022-01-18 NOTE — Progress Notes (Signed)
? ?BP 120/87   Pulse 98   Ht 5\' 5"  (1.651 m)   Wt 236 lb (107 kg)   LMP 07/02/2020   SpO2 98%   BMI 39.27 kg/m?   ? ?Subjective:  ? ?Patient ID: Caroline Hopkins, female    DOB: May 16, 1981, 41 y.o.   MRN: [id number] ? ?HPI: ?Caroline Hopkins is a 41 y.o. female presenting on 01/18/2022 for Obesity ? ? ?HPI ?Obesity and weight recheck ?Patient is coming in for obesity and weight recheck.  She has been doing the phentermine.  She is up on weight this time. 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  She says she will go all day and not feel hungry at all. ? ?Relevant past medical, surgical, family and social history reviewed and updated as indicated. Interim medical history since our last visit reviewed. ?Allergies and medications reviewed and updated. ? ?Review of Systems  ?Constitutional:  Positive for appetite change. Negative for chills and fever.  ?Eyes:  Negative for visual disturbance.  ?Respiratory:  Negative for chest tightness and shortness of breath.   ?Cardiovascular:  Negative for chest pain and leg swelling.  ?Musculoskeletal:  Negative for back pain and gait problem.  ?Skin:  Negative for rash.  ?Neurological:  Negative for dizziness, light-headedness and headaches.  ?Psychiatric/Behavioral:  Negative for agitation and behavioral problems.   ?All other systems reviewed and are negative. ? ?Per HPI unless specifically indicated above ? ? ?Allergies as of 01/18/2022   ? ?   Reactions  ? Latex Anaphylaxis  ? Blisters   ? Rocephin [ceftriaxone] Anaphylaxis  ? Oxycodone Nausea And Vomiting  ? Wellbutrin [bupropion]   ? Headaches  ? ?  ? ?  ?Medication List  ?  ? ?  ? Accurate as of January 18, 2022  4:54 PM. If you have any questions, ask your nurse or doctor.  ?  ?  ? ?  ? ?desvenlafaxine 100 MG 24 hr tablet ?Commonly known as:  Pristiq ?Take 1 tablet (100 mg total) by mouth daily. ?  ?ibuprofen 800 MG tablet ?Commonly known as: ADVIL ?TAKE ONE TABLET EVERY 8 HOURS AS NEEDED ?  ?montelukast 10 MG tablet ?Commonly known as: SINGULAIR ?TAKE ONE TABLET BY MOUTH AT BEDTIME ?  ?omeprazole 20 MG capsule ?Commonly known as: PRILOSEC ?TAKE ONE (1) CAPSULE EACH DAY ?  ?Opzelura 1.5 % Crea ?Generic drug: Ruxolitinib Phosphate ?Apply 1 application topically 2 (two) times daily as needed. ?  ?phentermine 30 MG capsule ?Take 1 capsule (30 mg total) by mouth every morning. ?What changed:  ?medication strength ?how much to take ?Changed by: January 20, 2022, MD ?  ?triamcinolone cream 0.1 % ?Commonly known as: KENALOG ?Apply 1 application topically 2 (two) times daily. ?  ? ?  ? ? ? ?Objective:  ? ?BP 120/87   Pulse 98   Ht 5\' 5"  (1.651 m)   Wt 236 lb (107 kg)   LMP 07/02/2020   SpO2 98%   BMI 39.27 kg/m?   ?Wt Readings from Last 3 Encounters:  ?01/18/22 236 lb (107 kg)  ?12/14/21 232 lb (105.2 kg)  ?11/12/21 235 lb (106.6 kg)  ?  ?Physical Exam ?Vitals and nursing note reviewed.  ?Constitutional:   ?   General: She is  not in acute distress. ?   Appearance: She is well-developed. She is not diaphoretic.  ?Eyes:  ?   Conjunctiva/sclera: Conjunctivae normal.  ?Cardiovascular:  ?   Rate and Rhythm: Normal rate and regular rhythm.  ?   Heart sounds: Normal heart sounds. No murmur heard. ?Pulmonary:  ?   Effort: Pulmonary effort is normal. No respiratory distress.  ?   Breath sounds: Normal breath sounds. No wheezing.  ?Skin: ?   General: Skin is warm and dry.  ?   Findings: No rash.  ?Neurological:  ?   Mental Status: She is alert and oriented to person, place, and time.  ?   Coordination: Coordination normal.  ?Psychiatric:     ?   Behavior: Behavior normal.  ? ? ? ? ?Assessment & Plan:  ? ?Problem List Items Addressed This Visit   ? ?  ? Other  ? Obesity, unspecified - Primary  ? Relevant Medications  ? phentermine 30 MG capsule  ?Will lower the  phentermine and see if that is more tolerable for her. ? ?Follow up plan: ?Return in about 4 weeks (around 02/15/2022), or if symptoms worsen or fail to improve, for Obesity and weight recheck. ? ?Counseling provided for all of the vaccine components ?No orders of the defined types were placed in this encounter. ? ? ?Arville Care, MD ?[facility] ?01/18/2022, 4:54 PM ? ? ? ? ?

## 2022-02-24 ENCOUNTER — Ambulatory Visit: Payer: No Typology Code available for payment source | Admitting: Family Medicine

## 2022-03-10 IMAGING — MG MM DIGITAL SCREENING BILAT W/ TOMO AND CAD
8 series · 8 of 24 positions shown · non-contrast
Comparison: Previous exam(s).

ACR Breast Density Category a: The breast tissue is almost entirely
fatty.

CLINICAL DATA: Screening.

EXAM:
DIGITAL SCREENING BILATERAL MAMMOGRAM WITH TOMOSYNTHESIS AND CAD
TECHNIQUE: Bilateral screening digital craniocaudal and mediolateral oblique
mammograms were obtained. Bilateral screening digital breast
tomosynthesis was performed. The images were evaluated with
computer-aided detection.

[R CC synth-2D]
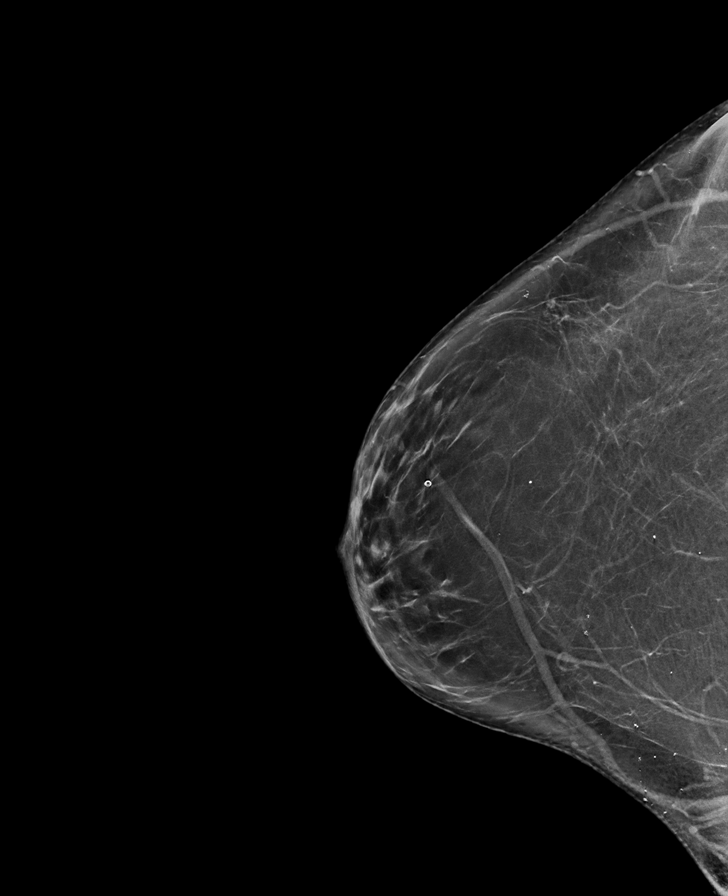

[L CC synth-2D]
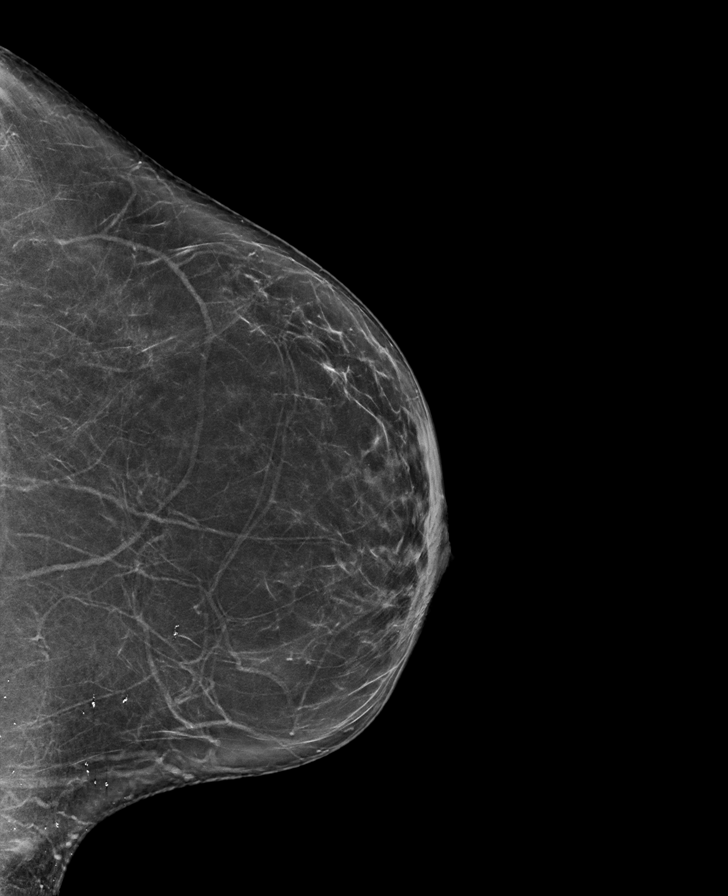

[L MLO synth-2D]
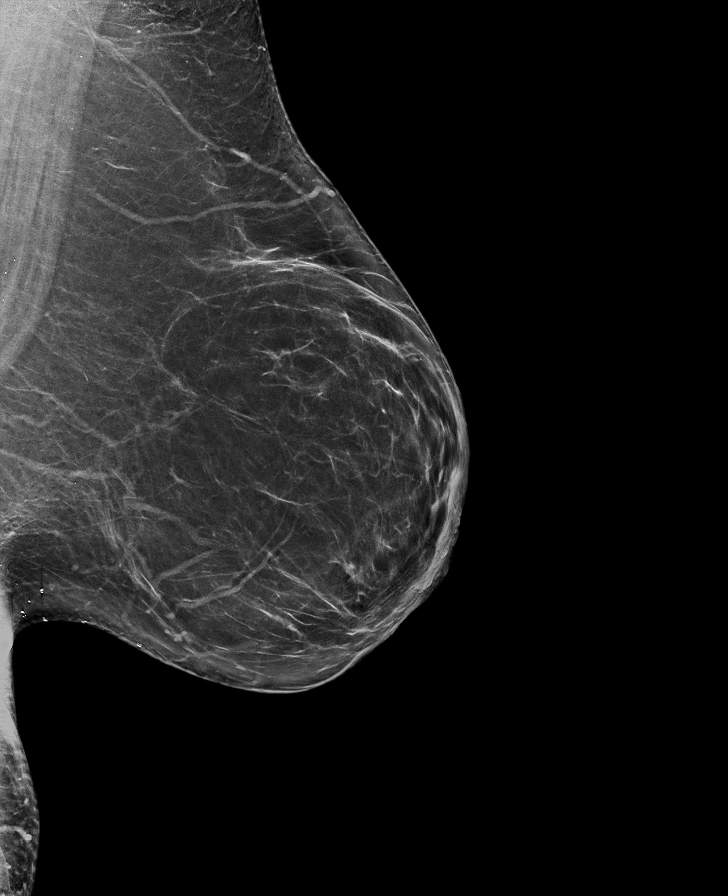

[R MLO synth-2D]
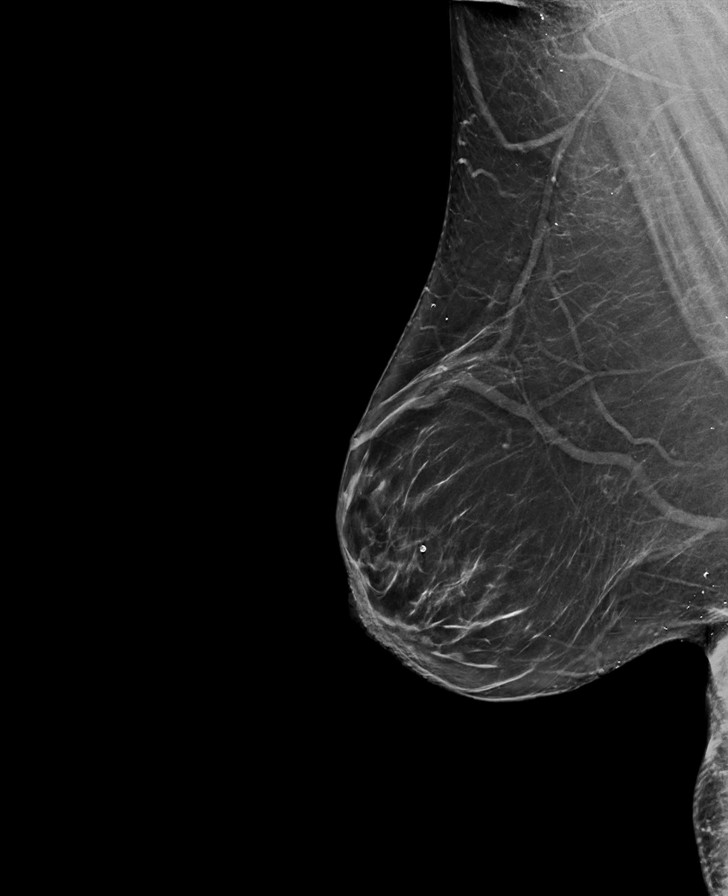

[R CC tomo · tomo slice 39/76.0]
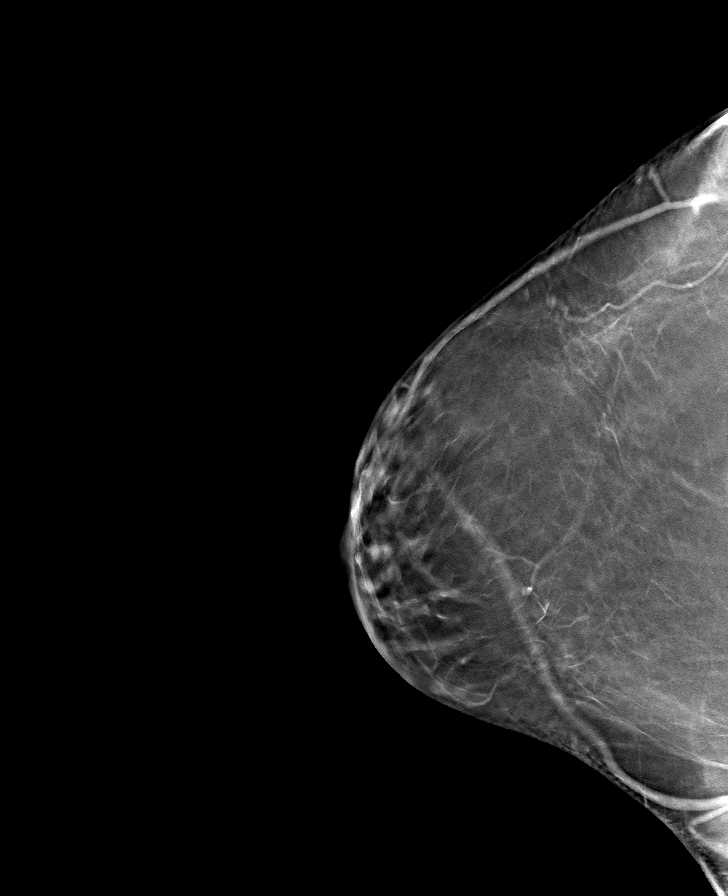

[L CC tomo · tomo slice 39/78.0]
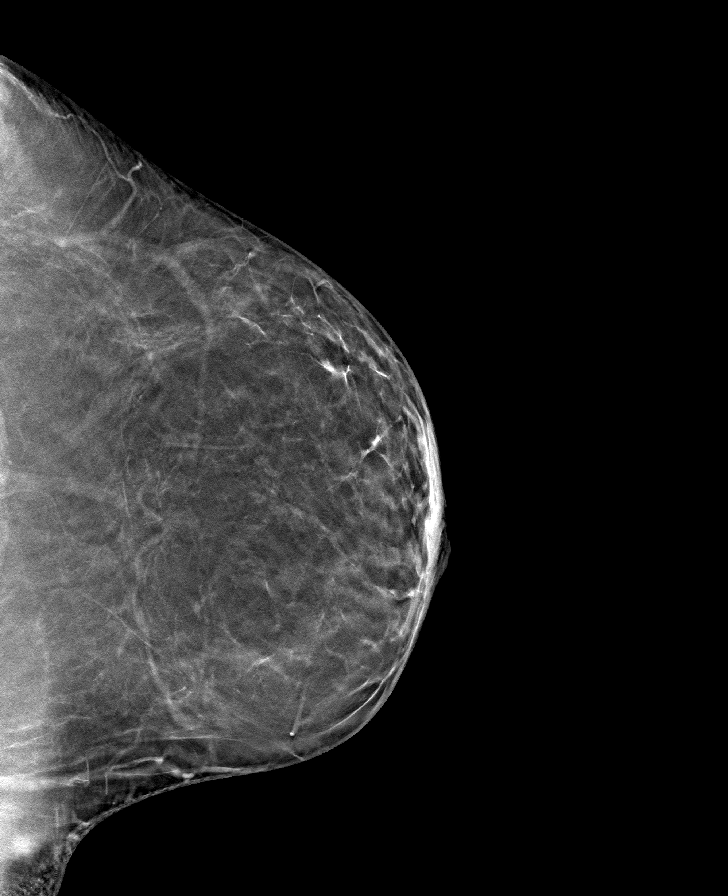

[R MLO tomo · tomo slice 39/78.0]
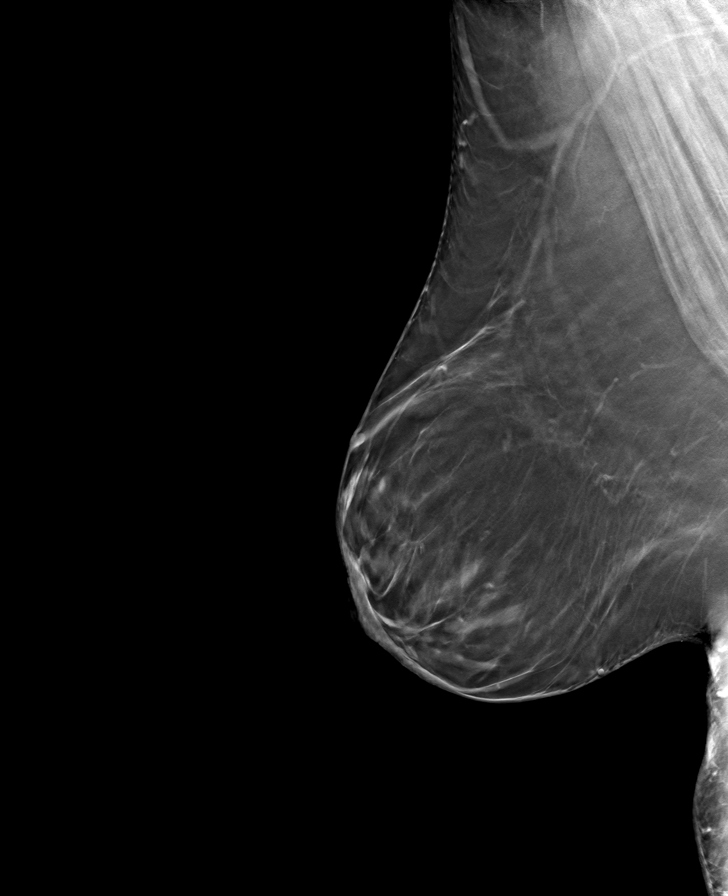

[L MLO tomo · tomo slice 42/83.0]
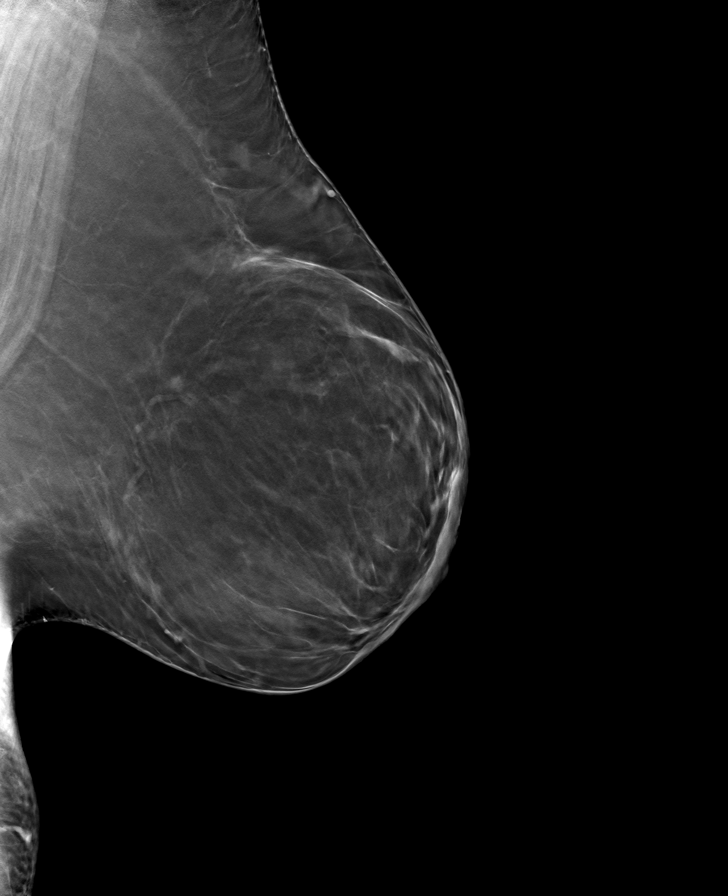

[8 of 24 positions shown; findings below may reference images not displayed]

FINDINGS: There are no findings suspicious for malignancy.
IMPRESSION: No mammographic evidence of malignancy. A result letter of this
screening mammogram will be mailed directly to the patient.

RECOMMENDATION:
Screening mammogram in one year. (Code:0E-3-N98)

BI-RADS CATEGORY  1: Negative.

## 2022-03-26 ENCOUNTER — Other Ambulatory Visit: Payer: Self-pay | Admitting: Family Medicine

## 2022-03-26 DIAGNOSIS — K219 Gastro-esophageal reflux disease without esophagitis: Secondary | ICD-10-CM

## 2022-05-21 ENCOUNTER — Telehealth: Payer: Self-pay | Admitting: Allergy & Immunology

## 2022-05-21 MED ORDER — OPZELURA 1.5 % EX CREA: 1.0000 "application " | TOPICAL_CREAM | Freq: Two times a day (BID) | CUTANEOUS | 1 refills | Status: DC | PRN

## 2022-05-21 NOTE — Telephone Encounter (Signed)
Sent in Courtesy refill into the [facility]. Also called and informed patient  Caroline Hopkins [phone]

## 2022-05-21 NOTE — Telephone Encounter (Signed)
Patient requested appointment as she needs additional refills. Patient scheduled appointment for 06-11-2022.  Patient is requesting for a courtesy refill for Opzelura - pt states it was sent to specialty pharmacy. Advised patient after courtesy refill no additional refills would be sent in if appointment is missed. Patient verbalized understanding.

## 2022-06-11 ENCOUNTER — Ambulatory Visit: Payer: No Typology Code available for payment source | Admitting: Family Medicine

## 2022-06-11 ENCOUNTER — Encounter: Payer: Self-pay | Admitting: Family Medicine

## 2022-06-11 VITALS — BP 122/90 | HR 76 | Temp 98.2°F | Resp 18 | Ht 64.25 in | Wt 242.0 lb

## 2022-06-11 DIAGNOSIS — L2084 Intrinsic (allergic) eczema: Secondary | ICD-10-CM | POA: Diagnosis not present

## 2022-06-11 DIAGNOSIS — J302 Other seasonal allergic rhinitis: Secondary | ICD-10-CM

## 2022-06-11 DIAGNOSIS — J3089 Other allergic rhinitis: Secondary | ICD-10-CM | POA: Diagnosis not present

## 2022-06-11 NOTE — Patient Instructions (Signed)
Allergic rhinitis Continue allergen avoidance measures directed toward pollen, mold, dust mite, and pets as listed below Continue Xyzal 2.5-5 mg once a day as needed for runny nose or itch Consider saline nasal rinses as needed for nasal symptoms. Use this before any medicated nasal sprays for best result If your symptoms are not well controlled with the treatment plan as listed above, consider allergen immunotherapy  Atopic dermatitis Continue with the twice a day moisturizing routine Continue Opzulura up to twice a day to red and itchy areas.  This medication does not contain a steroid and is safe to use on sensitive areas  Call the clinic if this treatment plan is not working well for you.  Follow up in 1 year or sooner if needed.  Reducing Pollen Exposure The American Academy of Allergy, Asthma and Immunology suggests the following steps to reduce your exposure to pollen during allergy seasons. Do not hang sheets or clothing out to dry; pollen may collect on these items. Do not mow lawns or spend time around freshly cut grass; mowing stirs up pollen. Keep windows closed at night.  Keep car windows closed while driving. Minimize morning activities outdoors, a time when pollen counts are usually at their highest. Stay indoors as much as possible when pollen counts or humidity is high and on windy days when pollen tends to remain in the air longer. Use air conditioning when possible.  Many air conditioners have filters that trap the pollen spores. Use a HEPA room air filter to remove pollen form the indoor air you breathe.  Control of Mold Allergen Mold and fungi can grow on a variety of surfaces provided certain temperature and moisture conditions exist.  Outdoor molds grow on plants, decaying vegetation and soil.  The major outdoor mold, Alternaria and Cladosporium, are found in very high numbers during hot and dry conditions.  Generally, a late Summer - Fall peak is seen for common  outdoor fungal spores.  Rain will temporarily lower outdoor mold spore count, but counts rise rapidly when the rainy period ends.  The most important indoor molds are Aspergillus and Penicillium.  Dark, humid and poorly ventilated basements are ideal sites for mold growth.  The next most common sites of mold growth are the bathroom and the kitchen.  Outdoor [employer] Use air conditioning and keep windows closed Avoid exposure to decaying vegetation. Avoid leaf raking. Avoid grain handling. Consider wearing a face mask if working in moldy areas.  Indoor Mold Control Maintain humidity below 50%. Clean washable surfaces with 5% bleach solution. Remove sources e.g. Contaminated carpets.   Control of Dust Mite Allergen Dust mites play a major role in allergic asthma and rhinitis. They occur in environments with high humidity wherever human skin is found. Dust mites absorb humidity from the atmosphere (ie, they do not drink) and feed on organic matter (including shed human and animal skin). Dust mites are a microscopic type of insect that you cannot see with the naked eye. High levels of dust mites have been detected from mattresses, pillows, carpets, upholstered furniture, bed covers, clothes, soft toys and any woven material. The principal allergen of the dust mite is found in its feces. A gram of dust may contain 1,000 mites and 250,000 fecal particles. Mite antigen is easily measured in the air during house cleaning activities. Dust mites do not bite and do not cause harm to humans, other than by triggering allergies/asthma.  Ways to decrease your exposure to dust mites in your home:  1. Encase mattresses, box springs and pillows with a mite-impermeable barrier or cover  2. Wash sheets, blankets and drapes weekly in hot water (130 F) with detergent and dry them in a dryer on the hot setting.  3. Have the room cleaned frequently with a vacuum cleaner and a damp dust-mop. For carpeting or  rugs, vacuuming with a vacuum cleaner equipped with a high-efficiency particulate air (HEPA) filter. The dust mite allergic individual should not be in a room which is being cleaned and should wait 1 hour after cleaning before going into the room.  4. Do not sleep on upholstered furniture (eg, couches).  5. If possible removing carpeting, upholstered furniture and drapery from the home is ideal. Horizontal blinds should be eliminated in the rooms where the person spends the most time (bedroom, study, television room). Washable vinyl, roller-type shades are optimal.  6. Remove all non-washable stuffed toys from the bedroom. Wash stuffed toys weekly like sheets and blankets above.  7. Reduce indoor humidity to less than 50%. Inexpensive humidity monitors can be purchased at most hardware stores. Do not use a humidifier as can make the problem worse and are not recommended.  Control of Dog or Cat Allergen Avoidance is the best way to manage a dog or cat allergy. If you have a dog or cat and are allergic to dog or cats, consider removing the dog or cat from the home. If you have a dog or cat but don't want to find it a new home, or if your family wants a pet even though someone in the household is allergic, here are some strategies that may help keep symptoms at bay:  Keep the pet out of your bedroom and restrict it to only a few rooms. Be advised that keeping the dog or cat in only one room will not limit the allergens to that room. Don't pet, hug or kiss the dog or cat; if you do, wash your hands with soap and water. High-efficiency particulate air (HEPA) cleaners run continuously in a bedroom or living room can reduce allergen levels over time. Regular use of a high-efficiency vacuum cleaner or a central vacuum can reduce allergen levels. Giving your dog or cat a bath at least once a week can reduce airborne allergen.

## 2022-06-11 NOTE — Progress Notes (Signed)
[address] [place] [address] Dept: [phone]  FOLLOW UP NOTE  Patient ID: Caroline Hopkins, female    DOB: [birth year]  Age: 41 y.o. MRN: [id number] Date of Office Visit: 06/11/2022  Assessment  Chief Complaint: Allergic Rhinitis  and Eczema (Is having some flares. The cream seams to be doing the trick. Last year it was on her arms and this year it is on her legs and hands. )  HPI Caroline Hopkins is a 41 year old female who presents to the clinic for follow-up visit.  She was last seen in this clinic on 07/08/2021 by Dr. Dellis Anes for evaluation of allergic rhinitis and atopic dermatitis.  At today's visit, she reports allergic rhinitis has been well controlled with only intermittent symptoms including occasional clear rhinorrhea and nasal congestion.  She continues Xyzol 2-1/2 mg once a day as needed with relief of symptoms.  Her last environmental allergy skin testing was on 06/26/2021 and was positive to grass pollen, indoor molds, outdoor molds, dust mite, cat, and dog.  Atopic dermatitis is reported as moderately well controlled with occasional red and itchy areas that occur in a flare in remission pattern mainly on her foot and leg.  She reports her rashes have been relatively mild this year.  She continues a twice a day moisturizing routine and uses Opzlura up to twice a day as needed with relief of symptoms.  Her current medications are listed in the chart.   Drug Allergies:  Allergies  Allergen Reactions   Latex Anaphylaxis    Blisters    Rocephin [Ceftriaxone] Anaphylaxis   Oxycodone Nausea And Vomiting   Wellbutrin [Bupropion]     Headaches    Physical Exam: BP (!) 122/90   Pulse 76   Temp 98.2 F (36.8 C)   Resp 18   Ht 5' 4.25" (1.632 m)   Wt 242 lb (109.8 kg)   LMP 07/02/2020   SpO2 97%   BMI 41.22 kg/m    Physical Exam Vitals reviewed.  Constitutional:      Appearance: Normal appearance.  HENT:     Head: Normocephalic and atraumatic.      Right Ear: Tympanic membrane normal.     Left Ear: Tympanic membrane normal.     Nose:     Comments: Bilateral nares slightly erythematous with clear nasal drainage noted.  Pharynx normal.  Ears normal.  Eyes normal.    Mouth/Throat:     Pharynx: Oropharynx is clear.  Eyes:     Conjunctiva/sclera: Conjunctivae normal.  Cardiovascular:     Rate and Rhythm: Normal rate and regular rhythm.     Heart sounds: Normal heart sounds. No murmur heard. Pulmonary:     Effort: Pulmonary effort is normal.     Breath sounds: Normal breath sounds.     Comments: Lungs clear to auscultation Musculoskeletal:        General: Normal range of motion.     Cervical back: Normal range of motion and neck supple.  Skin:    General: Skin is warm and dry.     Comments: Left foot with large edematous area with slightly lichenified skin.  No open areas or drainage noted.  Neurological:     Mental Status: She is alert and oriented to person, place, and time.  Psychiatric:        Mood and Affect: Mood normal.        Behavior: Behavior normal.        Thought Content: Thought content  normal.        Judgment: Judgment normal.    Assessment and Plan: 1. Seasonal and perennial allergic rhinitis   2. Intrinsic atopic dermatitis     No orders of the defined types were placed in this encounter.   Patient Instructions  Allergic rhinitis Continue allergen avoidance measures directed toward pollen, mold, dust mite, and pets as listed below Continue Xyzal 2.5-5 mg once a day as needed for runny nose or itch Consider saline nasal rinses as needed for nasal symptoms. Use this before any medicated nasal sprays for best result If your symptoms are not well controlled with the treatment plan as listed above, consider allergen immunotherapy  Atopic dermatitis Continue with the twice a day moisturizing routine Continue Opzulura up to twice a day to red and itchy areas.  This medication does not contain a steroid and  is safe to use on sensitive areas  Call the clinic if this treatment plan is not working well for you.  Follow up in 1 year or sooner if needed.   Return in about 1 year (around 06/12/2023), or if symptoms worsen or fail to improve.    Thank you for the opportunity to care for this patient.  Please do not hesitate to contact me with questions.  Thermon Leyland, FNP [facility]

## 2022-06-13 ENCOUNTER — Encounter: Payer: Self-pay | Admitting: Family Medicine

## 2022-06-13 DIAGNOSIS — L2084 Intrinsic (allergic) eczema: Secondary | ICD-10-CM | POA: Insufficient documentation

## 2022-06-13 DIAGNOSIS — J302 Other seasonal allergic rhinitis: Secondary | ICD-10-CM | POA: Insufficient documentation

## 2022-08-13 ENCOUNTER — Encounter: Payer: Self-pay | Admitting: Family Medicine

## 2022-08-13 ENCOUNTER — Ambulatory Visit: Payer: No Typology Code available for payment source | Admitting: Family Medicine

## 2022-08-13 VITALS — BP 117/83 | HR 79 | Ht 64.25 in | Wt 244.0 lb

## 2022-08-13 DIAGNOSIS — R232 Flushing: Secondary | ICD-10-CM | POA: Diagnosis not present

## 2022-08-13 DIAGNOSIS — R6889 Other general symptoms and signs: Secondary | ICD-10-CM | POA: Diagnosis not present

## 2022-08-13 DIAGNOSIS — F339 Major depressive disorder, recurrent, unspecified: Secondary | ICD-10-CM

## 2022-08-13 DIAGNOSIS — Z8639 Personal history of other endocrine, nutritional and metabolic disease: Secondary | ICD-10-CM

## 2022-08-13 DIAGNOSIS — R61 Generalized hyperhidrosis: Secondary | ICD-10-CM

## 2022-08-13 DIAGNOSIS — Z23 Encounter for immunization: Secondary | ICD-10-CM

## 2022-08-13 NOTE — Progress Notes (Signed)
BP 117/83   Pulse 79   Ht 5' 4.25" (1.632 m)   Wt 244 lb (110.7 kg)   LMP 07/02/2020   SpO2 98%   BMI 41.56 kg/m    Subjective:   Patient ID: Caroline Hopkins, female    DOB: [birth year], 41 y.o.   MRN: [id number]  HPI: Caroline Hopkins is a 42 y.o. female presenting on 08/13/2022 for Medical Management of Chronic Issues and menopausal symptoms (Hot flashes, night sweats, moody, jt pain)   HPI Patient comes in hot flashes and night sweats and mood swings joint pain issues has been going on over the past few months.  She says it hit her hard and she has been having it a lot.  She is concerned that she is having menopause but she does not cycle anymore because she had a hysterectomy.  When she had a hysterectomy they did not take the ovaries per the records that we can find.  She did stop the Pristiq many months ago but does not think it was when this started, she did feel quite a bit before.  Relevant past medical, surgical, family and social history reviewed and updated as indicated. Interim medical history since our last visit reviewed. Allergies and medications reviewed and updated.  Review of Systems  Constitutional:  Negative for chills and fever.  Eyes:  Negative for visual disturbance.  Respiratory:  Negative for chest tightness and shortness of breath.   Cardiovascular:  Negative for chest pain and leg swelling.  Endocrine: Positive for heat intolerance.  Genitourinary:  Negative for difficulty urinating, dysuria, menstrual problem and pelvic pain.  Musculoskeletal:  Positive for arthralgias.  Skin:  Negative for rash.  Neurological:  Negative for dizziness, light-headedness and headaches.  Psychiatric/Behavioral:  Positive for dysphoric mood and sleep disturbance. Negative for agitation, behavioral problems, self-injury and suicidal ideas. The patient is nervous/anxious.   All other systems reviewed and are negative.   Per HPI unless specifically indicated  above   Allergies as of 08/13/2022       Reactions   Latex Anaphylaxis   Blisters    Rocephin [ceftriaxone] Anaphylaxis   Oxycodone Nausea And Vomiting   Wellbutrin [bupropion]    Headaches        Medication List        Accurate as of August 13, 2022  2:10 PM. If you have any questions, ask your nurse or doctor.          STOP taking these medications    desvenlafaxine 100 MG 24 hr tablet Commonly known as: Pristiq Stopped by: Worthy Rancher, MD   ibuprofen 800 MG tablet Commonly known as: ADVIL Stopped by: Fransisca Kaufmann Maverick Patman, MD   montelukast 10 MG tablet Commonly known as: SINGULAIR Stopped by: Worthy Rancher, MD   phentermine 30 MG capsule Stopped by: Fransisca Kaufmann Kayode Petion, MD   triamcinolone cream 0.1 % Commonly known as: KENALOG Stopped by: Fransisca Kaufmann Mayra Jolliffe, MD       TAKE these medications    levocetirizine 5 MG tablet Commonly known as: XYZAL Take 5 mg by mouth every evening. Takes 1/2 tab.   omeprazole 20 MG capsule Commonly known as: PRILOSEC TAKE ONE (1) CAPSULE EACH DAY   Opzelura 1.5 % Crea Generic drug: Ruxolitinib Phosphate Apply 1 application  topically 2 (two) times daily as needed.         Objective:   BP 117/83   Pulse 79   Ht 5' 4.25" (1.632 m)  Wt 244 lb (110.7 kg)   LMP 07/02/2020   SpO2 98%   BMI 41.56 kg/m   Wt Readings from Last 3 Encounters:  08/13/22 244 lb (110.7 kg)  06/11/22 242 lb (109.8 kg)  01/18/22 236 lb (107 kg)    Physical Exam Vitals and nursing note reviewed.  Constitutional:      General: She is not in acute distress.    Appearance: She is well-developed. She is not diaphoretic.  Eyes:     Conjunctiva/sclera: Conjunctivae normal.  Cardiovascular:     Rate and Rhythm: Normal rate and regular rhythm.     Heart sounds: Normal heart sounds. No murmur heard. Pulmonary:     Effort: Pulmonary effort is normal. No respiratory distress.     Breath sounds: Normal breath sounds. No  wheezing.  Abdominal:     General: Abdomen is flat. Bowel sounds are normal. There is no distension.     Tenderness: There is no abdominal tenderness. There is no guarding or rebound.  Musculoskeletal:        General: No tenderness. Normal range of motion.  Skin:    General: Skin is warm and dry.     Findings: No rash.  Neurological:     Mental Status: She is alert and oriented to person, place, and time.     Coordination: Coordination normal.  Psychiatric:        Behavior: Behavior normal.       Assessment & Plan:   Problem List Items Addressed This Visit       Other   Depression, recurrent ([place])   Relevant Orders   CBC with Differential/Platelet   CMP14+EGFR   Vitamin B12   VITAMIN D 25 Hydroxy (Vit-D Deficiency, Fractures)   H/O vitamin D deficiency   Relevant Orders   CBC with Differential/Platelet   CMP14+EGFR   Vitamin B12   VITAMIN D 25 Hydroxy (Vit-D Deficiency, Fractures)   Other Visit Diagnoses     Hot flashes    -  Primary   Relevant Orders   TSH+Prl+FSH+TestT+LH+DHEA S...   CBC with Differential/Platelet   CMP14+EGFR   Vitamin B12   VITAMIN D 25 Hydroxy (Vit-D Deficiency, Fractures)   Need for immunization against influenza       Relevant Orders   Flu Vaccine QUAD 74moIM (Fluarix, Fluzone & Alfiuria Quad PF) (Completed)   Heat intolerance       Relevant Orders   TSH+Prl+FSH+TestT+LH+DHEA S...   CBC with Differential/Platelet   CMP14+EGFR   Vitamin B12   VITAMIN D 25 Hydroxy (Vit-D Deficiency, Fractures)   Night sweats       Relevant Orders   TSH+Prl+FSH+TestT+LH+DHEA S...   CBC with Differential/Platelet   CMP14+EGFR   Vitamin B12   VITAMIN D 25 Hydroxy (Vit-D Deficiency, Fractures)       We will do blood work in the future consider medication for possible menopausal symptoms.  We will schedule back and discuss women's physical time Follow up plan: Return if symptoms worsen or fail to improve, for Pap smear and physical when  available.  Counseling provided for all of the vaccine components Orders Placed This Encounter  Procedures   Flu Vaccine QUAD 627moM (Fluarix, Fluzone & Alfiuria Quad PF)   TSH+Prl+FSH+TestT+LH+DHEA S...   CBC with Differential/Platelet   CMP14+EGFR   Vitamin B12   VITAMIN D 25 Hydroxy (Vit-D Deficiency, Fractures)    JoCaryl PinaMD [facility] 08/13/2022, 2:10 PM

## 2022-08-19 LAB — CMP14+EGFR
ALT: 22 IU/L (ref 0–32)
AST: 21 IU/L (ref 0–40)
Albumin/Globulin Ratio: 1.6 (ref 1.2–2.2)
Albumin: 4.5 g/dL (ref 3.9–4.9)
Alkaline Phosphatase: 56 IU/L (ref 44–121)
BUN/Creatinine Ratio: 19 (ref 9–23)
BUN: 11 mg/dL (ref 6–24)
Bilirubin Total: 0.5 mg/dL (ref 0.0–1.2)
CO2: 25 mmol/L (ref 20–29)
Calcium: 9.4 mg/dL (ref 8.7–10.2)
Chloride: 101 mmol/L (ref 96–106)
Creatinine, Ser: 0.58 mg/dL (ref 0.57–1.00)
Globulin, Total: 2.8 g/dL (ref 1.5–4.5)
Glucose: 77 mg/dL (ref 70–99)
Potassium: 4 mmol/L (ref 3.5–5.2)
Sodium: 139 mmol/L (ref 134–144)
Total Protein: 7.3 g/dL (ref 6.0–8.5)
eGFR: 116 mL/min/{1.73_m2} (ref >59)

## 2022-08-19 LAB — TSH+PRL+FSH+TESTT+LH+DHEA S...
17-Hydroxyprogesterone: 21 ng/dL
Androstenedione: 48 ng/dL (ref 41–262)
DHEA-SO4: 113 ug/dL (ref 57.3–279.2)
FSH: 19 m[IU]/mL
LH: 32.5 m[IU]/mL
Prolactin: 13.5 ng/mL (ref 4.8–23.3)
TSH: 1.23 u[IU]/mL (ref 0.450–4.500)
Testosterone, Free: 3.4 pg/mL (ref 0.0–4.2)
Testosterone: 18 ng/dL (ref 4–50)

## 2022-08-19 LAB — CBC WITH DIFFERENTIAL/PLATELET
Basophils Absolute: 0.1 10*3/uL (ref 0.0–0.2)
Basos: 1 %
EOS (ABSOLUTE): 0.5 10*3/uL — ABNORMAL HIGH (ref 0.0–0.4)
Eos: 5 %
Hematocrit: 40.3 % (ref 34.0–46.6)
Hemoglobin: 13.6 g/dL (ref 11.1–15.9)
Immature Grans (Abs): 0 10*3/uL (ref 0.0–0.1)
Immature Granulocytes: 0 %
Lymphocytes Absolute: 2.6 10*3/uL (ref 0.7–3.1)
Lymphs: 27 %
MCH: 30.4 pg (ref 26.6–33.0)
MCHC: 33.7 g/dL (ref 31.5–35.7)
MCV: 90 fL (ref 79–97)
Monocytes Absolute: 0.5 10*3/uL (ref 0.1–0.9)
Monocytes: 5 %
Neutrophils Absolute: 6 10*3/uL (ref 1.4–7.0)
Neutrophils: 62 %
Platelets: 273 10*3/uL (ref 150–450)
RBC: 4.47 x10E6/uL (ref 3.77–5.28)
RDW: 13 % (ref 11.7–15.4)
WBC: 9.7 10*3/uL (ref 3.4–10.8)

## 2022-08-19 LAB — VITAMIN B12: Vitamin B-12: 421 pg/mL (ref 232–1245)

## 2022-08-19 LAB — VITAMIN D 25 HYDROXY (VIT D DEFICIENCY, FRACTURES): Vit D, 25-Hydroxy: 21.8 ng/mL — ABNORMAL LOW (ref 30.0–100.0)

## 2022-08-26 ENCOUNTER — Ambulatory Visit: Payer: No Typology Code available for payment source | Admitting: Radiology

## 2022-09-24 ENCOUNTER — Encounter: Payer: No Typology Code available for payment source | Admitting: Family Medicine

## 2022-11-17 ENCOUNTER — Encounter: Payer: Self-pay | Admitting: Family Medicine

## 2022-11-17 ENCOUNTER — Ambulatory Visit (INDEPENDENT_AMBULATORY_CARE_PROVIDER_SITE_OTHER): Payer: No Typology Code available for payment source | Admitting: Family Medicine

## 2022-11-17 ENCOUNTER — Other Ambulatory Visit (HOSPITAL_COMMUNITY)
Admission: RE | Admit: 2022-11-17 | Discharge: 2022-11-17 | Disposition: A | Discharge status: Home / Self Care | Payer: No Typology Code available for payment source | Source: Ambulatory Visit | Attending: Family Medicine | Admitting: Family Medicine

## 2022-11-17 VITALS — BP 118/75 | HR 69 | Temp 98.2°F | Ht 64.25 in | Wt 233.0 lb

## 2022-11-17 DIAGNOSIS — K219 Gastro-esophageal reflux disease without esophagitis: Secondary | ICD-10-CM

## 2022-11-17 DIAGNOSIS — Z114 Encounter for screening for human immunodeficiency virus [HIV]: Secondary | ICD-10-CM | POA: Diagnosis not present

## 2022-11-17 DIAGNOSIS — Z1159 Encounter for screening for other viral diseases: Secondary | ICD-10-CM | POA: Diagnosis not present

## 2022-11-17 DIAGNOSIS — Z01411 Encounter for gynecological examination (general) (routine) with abnormal findings: Secondary | ICD-10-CM | POA: Diagnosis not present

## 2022-11-17 DIAGNOSIS — Z01419 Encounter for gynecological examination (general) (routine) without abnormal findings: Secondary | ICD-10-CM | POA: Insufficient documentation

## 2022-11-17 LAB — URINALYSIS
Bilirubin, UA: NEGATIVE
Glucose, UA: NEGATIVE
Ketones, UA: NEGATIVE
Leukocytes,UA: NEGATIVE
Nitrite, UA: NEGATIVE
Protein,UA: NEGATIVE
RBC, UA: NEGATIVE
Specific Gravity, UA: 1.015 (ref 1.005–1.030)
Urobilinogen, Ur: 0.2 mg/dL (ref 0.2–1.0)
pH, UA: 6.5 (ref 5.0–7.5)

## 2022-11-17 MED ORDER — IBUPROFEN 800 MG PO TABS
800.0000 mg | ORAL_TABLET | Freq: Four times a day (QID) | ORAL | 1 refills | Status: DC | PRN
Start: 2022-11-17 — End: 2023-08-04

## 2022-11-17 MED ORDER — OMEPRAZOLE 20 MG PO CPDR: DELAYED_RELEASE_CAPSULE | ORAL | 3 refills | Status: DC

## 2022-11-17 NOTE — Progress Notes (Signed)
BP 118/75   Pulse 69   Temp 98.2 F (36.8 C)   Ht 5' 4.25" (1.632 m)   Wt 233 lb (105.7 kg)   LMP 07/02/2020   SpO2 99%   BMI 39.68 kg/m    Subjective:   Patient ID: Caroline Hopkins, female    DOB: 1981/01/19, 42 y.o.   MRN: [id number]  HPI: Caroline Hopkins is a 42 y.o. female presenting on 11/17/2022 for Medical Management of Chronic Issues (CPE with pap)   HPI Well woman exam and physical Patient is coming in today for well woman exam and physical.  She has had a hysterectomy.  She denies any vaginal issues or breast issues.  She does have a family history of breast cancer in grandmother.  She does keep up on her mammograms.  Patient denies any chest pain, shortness of breath, headaches or vision issues, abdominal complaints, diarrhea, nausea, vomiting, or joint issues.   GERD Patient is currently on omeprazole.  She denies any major symptoms or abdominal pain or belching or burping. She denies any blood in her stool or lightheadedness or dizziness.   Relevant past medical, surgical, family and social history reviewed and updated as indicated. Interim medical history since our last visit reviewed. Allergies and medications reviewed and updated.  Review of Systems  Constitutional:  Negative for chills and fever.  HENT:  Negative for congestion, ear discharge, ear pain and tinnitus.   Eyes:  Negative for pain, redness and visual disturbance.  Respiratory:  Negative for cough, chest tightness, shortness of breath and wheezing.   Cardiovascular:  Negative for chest pain, palpitations and leg swelling.  Gastrointestinal:  Negative for abdominal pain, blood in stool, constipation and diarrhea.  Genitourinary:  Negative for difficulty urinating, dysuria and hematuria.  Musculoskeletal:  Negative for back pain, gait problem and myalgias.  Skin:  Negative for rash.  Neurological:  Negative for dizziness, weakness, light-headedness and headaches.  Psychiatric/Behavioral:  Negative for  agitation, behavioral problems and suicidal ideas.   All other systems reviewed and are negative.   Per HPI unless specifically indicated above   Allergies as of 11/17/2022       Reactions   Latex Anaphylaxis   Blisters    Rocephin [ceftriaxone] Anaphylaxis   Oxycodone Nausea And Vomiting   Wellbutrin [bupropion]    Headaches        Medication List        Accurate as of November 17, 2022  2:46 PM. If you have any questions, ask your nurse or doctor.          ibuprofen 800 MG tablet Commonly known as: ADVIL Take 1 tablet (800 mg total) by mouth every 6 (six) hours as needed.   levocetirizine 5 MG tablet Commonly known as: XYZAL Take 5 mg by mouth every evening. Takes 1/2 tab.   omeprazole 20 MG capsule Commonly known as: PRILOSEC Take one capsule each day What changed: See the new instructions. Changed by: Fransisca Kaufmann Donoven Pett, MD   Opzelura 1.5 % Crea Generic drug: Ruxolitinib Phosphate Apply 1 application  topically 2 (two) times daily as needed.         Objective:   BP 118/75   Pulse 69   Temp 98.2 F (36.8 C)   Ht 5' 4.25" (1.632 m)   Wt 233 lb (105.7 kg)   LMP 07/02/2020   SpO2 99%   BMI 39.68 kg/m   Wt Readings from Last 3 Encounters:  11/17/22 233 lb (105.7 kg)  08/13/22 244 lb (110.7 kg)  06/11/22 242 lb (109.8 kg)    Physical Exam Vitals and nursing note reviewed. Exam conducted with a chaperone present.  Constitutional:      General: She is not in acute distress.    Appearance: She is well-developed. She is not diaphoretic.  Eyes:     Conjunctiva/sclera: Conjunctivae normal.  Neck:     Thyroid: No thyromegaly.  Cardiovascular:     Rate and Rhythm: Normal rate and regular rhythm.     Heart sounds: Normal heart sounds. No murmur heard. Pulmonary:     Effort: Pulmonary effort is normal. No respiratory distress.     Breath sounds: Normal breath sounds. No wheezing.  Chest:  Breasts:    Breasts are symmetrical.     Right: No  inverted nipple, mass, nipple discharge, skin change or tenderness.     Left: No inverted nipple, mass, nipple discharge, skin change or tenderness.  Abdominal:     General: Bowel sounds are normal. There is no distension.     Palpations: Abdomen is soft.     Tenderness: There is no abdominal tenderness. There is no guarding or rebound.  Genitourinary:    Exam position: Lithotomy position.     Labia:        Right: No rash or lesion.        Left: No rash or lesion.      Vagina: Normal.     Uterus: Absent.      Adnexa:        Right: No mass, tenderness or fullness.         Left: No mass, tenderness or fullness.    Musculoskeletal:        General: Normal range of motion.     Cervical back: Neck supple.  Lymphadenopathy:     Cervical: No cervical adenopathy.  Skin:    General: Skin is warm and dry.     Findings: No rash.  Neurological:     Mental Status: She is alert and oriented to person, place, and time.     Coordination: Coordination normal.  Psychiatric:        Behavior: Behavior normal.       Assessment & Plan:   Problem List Items Addressed This Visit       Digestive   GERD (gastroesophageal reflux disease)   Relevant Medications   omeprazole (PRILOSEC) 20 MG capsule   Other Relevant Orders   CMP14+EGFR   Other Visit Diagnoses     Well woman exam    -  Primary   Relevant Orders   Urinalysis (Completed)   Cytology - PAP([place])   Lipid panel   CMP14+EGFR   Need for hepatitis C screening test       Relevant Orders   Hepatitis C Antibody   Screening for HIV without presence of risk factors       Relevant Orders   HIV Antibody (routine testing w rflx)       Continue current medicine, will check blood work today.  She says her hot flashes are doing better and she is doing well with the heartburn medicine.  Urinalysis looks normal. Follow up plan: Return in about 1 year (around 11/18/2023), or if symptoms worsen or fail to improve, for  Physical.  Counseling provided for all of the vaccine components Orders Placed This Encounter  Procedures   Urinalysis   Lipid panel   CMP14+EGFR   Hepatitis C Antibody   HIV Antibody (routine  testing w rflx)    Arville Care, MD [facility] 11/17/2022, 2:46 PM

## 2022-11-18 LAB — CMP14+EGFR
ALT: 21 IU/L (ref 0–32)
AST: 19 IU/L (ref 0–40)
Albumin/Globulin Ratio: 1.6 (ref 1.2–2.2)
Albumin: 4.8 g/dL (ref 3.9–4.9)
Alkaline Phosphatase: 59 IU/L (ref 44–121)
BUN/Creatinine Ratio: 22 (ref 9–23)
BUN: 13 mg/dL (ref 6–24)
Bilirubin Total: 0.5 mg/dL (ref 0.0–1.2)
CO2: 25 mmol/L (ref 20–29)
Calcium: 10.3 mg/dL — ABNORMAL HIGH (ref 8.7–10.2)
Chloride: 99 mmol/L (ref 96–106)
Creatinine, Ser: 0.59 mg/dL (ref 0.57–1.00)
Globulin, Total: 3 g/dL (ref 1.5–4.5)
Glucose: 78 mg/dL (ref 70–99)
Potassium: 4.1 mmol/L (ref 3.5–5.2)
Sodium: 139 mmol/L (ref 134–144)
Total Protein: 7.8 g/dL (ref 6.0–8.5)
eGFR: 115 mL/min/{1.73_m2} (ref >59)

## 2022-11-18 LAB — LIPID PANEL
Chol/HDL Ratio: 4 ratio (ref 0.0–4.4)
Cholesterol, Total: 167 mg/dL (ref 100–199)
HDL: 42 mg/dL (ref >39)
LDL Chol Calc (NIH): 96 mg/dL (ref 0–99)
Triglycerides: 164 mg/dL — ABNORMAL HIGH (ref 0–149)
VLDL Cholesterol Cal: 29 mg/dL (ref 5–40)

## 2022-11-18 LAB — HEPATITIS C ANTIBODY: Hep C Virus Ab: NONREACTIVE

## 2022-11-18 LAB — HIV ANTIBODY (ROUTINE TESTING W REFLEX): HIV Screen 4th Generation wRfx: NONREACTIVE

## 2022-11-22 LAB — CYTOLOGY - PAP: Diagnosis: NEGATIVE

## 2022-11-30 ENCOUNTER — Other Ambulatory Visit: Payer: Self-pay | Admitting: Family Medicine

## 2022-11-30 DIAGNOSIS — Z1231 Encounter for screening mammogram for malignant neoplasm of breast: Secondary | ICD-10-CM

## 2022-12-06 ENCOUNTER — Inpatient Hospital Stay: Admission: RE | Admit: 2022-12-06 | Payer: No Typology Code available for payment source | Source: Ambulatory Visit

## 2023-02-04 ENCOUNTER — Inpatient Hospital Stay: Admission: RE | Admit: 2023-02-04 | Payer: No Typology Code available for payment source | Source: Ambulatory Visit

## 2023-04-08 ENCOUNTER — Emergency Department (HOSPITAL_BASED_OUTPATIENT_CLINIC_OR_DEPARTMENT_OTHER): Payer: No Typology Code available for payment source

## 2023-04-08 ENCOUNTER — Other Ambulatory Visit: Payer: Self-pay

## 2023-04-08 ENCOUNTER — Emergency Department (HOSPITAL_BASED_OUTPATIENT_CLINIC_OR_DEPARTMENT_OTHER)
Admission: EM | Admit: 2023-04-08 | Discharge: 2023-04-08 | Disposition: A | Discharge status: Home / Self Care | Payer: No Typology Code available for payment source | Attending: Emergency Medicine | Admitting: Emergency Medicine

## 2023-04-08 ENCOUNTER — Encounter (HOSPITAL_BASED_OUTPATIENT_CLINIC_OR_DEPARTMENT_OTHER): Payer: Self-pay

## 2023-04-08 DIAGNOSIS — Z87891 Personal history of nicotine dependence: Secondary | ICD-10-CM | POA: Insufficient documentation

## 2023-04-08 DIAGNOSIS — M25512 Pain in left shoulder: Secondary | ICD-10-CM | POA: Insufficient documentation

## 2023-04-08 DIAGNOSIS — R1084 Generalized abdominal pain: Secondary | ICD-10-CM | POA: Diagnosis not present

## 2023-04-08 DIAGNOSIS — Z91040 Latex allergy status: Secondary | ICD-10-CM | POA: Insufficient documentation

## 2023-04-08 DIAGNOSIS — R0789 Other chest pain: Secondary | ICD-10-CM | POA: Diagnosis not present

## 2023-04-08 DIAGNOSIS — M79641 Pain in right hand: Secondary | ICD-10-CM | POA: Diagnosis not present

## 2023-04-08 DIAGNOSIS — E876 Hypokalemia: Secondary | ICD-10-CM | POA: Insufficient documentation

## 2023-04-08 DIAGNOSIS — E041 Nontoxic single thyroid nodule: Secondary | ICD-10-CM

## 2023-04-08 LAB — COMPREHENSIVE METABOLIC PANEL
ALT: 21 U/L (ref 0–44)
AST: 23 U/L (ref 15–41)
Albumin: 3.7 g/dL (ref 3.5–5.0)
Alkaline Phosphatase: 43 U/L (ref 38–126)
Anion gap: 10 (ref 5–15)
BUN: 12 mg/dL (ref 6–20)
CO2: 24 mmol/L (ref 22–32)
Calcium: 8.7 mg/dL — ABNORMAL LOW (ref 8.9–10.3)
Chloride: 105 mmol/L (ref 98–111)
Creatinine, Ser: 0.55 mg/dL (ref 0.44–1.00)
GFR, Estimated: >60 mL/min (ref >60)
Glucose, Bld: 113 mg/dL — ABNORMAL HIGH (ref 70–99)
Potassium: 3.3 mmol/L — ABNORMAL LOW (ref 3.5–5.1)
Sodium: 139 mmol/L (ref 135–145)
Total Bilirubin: 0.7 mg/dL (ref 0.3–1.2)
Total Protein: 7.3 g/dL (ref 6.5–8.1)

## 2023-04-08 LAB — TROPONIN I (HIGH SENSITIVITY): Troponin I (High Sensitivity): 3 ng/L (ref <18)

## 2023-04-08 MED ORDER — CYCLOBENZAPRINE HCL 10 MG PO TABS: 10.0000 mg | ORAL_TABLET | Freq: Two times a day (BID) | ORAL | 0 refills | Status: DC | PRN

## 2023-04-08 MED ORDER — IOHEXOL 300 MG/ML  SOLN
100.0000 mL | Freq: Once | INTRAMUSCULAR | Status: AC | PRN
  Administered 2023-04-08: 100 mL via INTRAVENOUS

## 2023-04-08 MED ORDER — IBUPROFEN 400 MG PO TABS
600.0000 mg | ORAL_TABLET | Freq: Once | ORAL | Status: AC
  Administered 2023-04-08: 600 mg via ORAL
  Filled 2023-04-08: qty 1

## 2023-04-08 NOTE — Discharge Instructions (Addendum)
Note the workup today was overall reassuring.  No evidence of traumatic injury such as a heart or to the organs of your abdomen.  No evidence of fracture or dislocation on imaging studies were obtained.  Recommend treatment of pain at home with nonsteroidal medications recommend ibuprofen as well as muscle laxer to use as needed.  Muscle laxer can cause drowsiness so please do not drive while taking medication. Your CT did show abnormalities of your thyroid.  Recommend follow-up with primary care for ultrasound and then for scheduled potential biopsy.  Please not hesitate to return to emergency department for worrisome signs and symptoms we discussed become apparent
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
## 2023-04-08 NOTE — ED Notes (Addendum)
Patient transported to scans ? ?

## 2023-04-25 ENCOUNTER — Ambulatory Visit: Payer: No Typology Code available for payment source | Admitting: Family Medicine

## 2023-04-25 ENCOUNTER — Encounter: Payer: Self-pay | Admitting: Family Medicine

## 2023-04-25 VITALS — BP 122/84 | HR 64 | Ht 65.0 in | Wt 227.0 lb

## 2023-04-25 DIAGNOSIS — E041 Nontoxic single thyroid nodule: Secondary | ICD-10-CM | POA: Diagnosis not present
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
## 2023-04-26 ENCOUNTER — Encounter: Payer: Self-pay | Admitting: Family Medicine

## 2023-04-26 LAB — CMP14+EGFR
ALT: 19 IU/L (ref 0–32)
AST: 18 IU/L (ref 0–40)
Albumin: 4.4 g/dL (ref 3.9–4.9)
Alkaline Phosphatase: 58 IU/L (ref 44–121)
BUN/Creatinine Ratio: 19 (ref 9–23)
BUN: 12 mg/dL (ref 6–24)
Bilirubin Total: 0.4 mg/dL (ref 0.0–1.2)
CO2: 23 mmol/L (ref 20–29)
Calcium: 10.1 mg/dL (ref 8.7–10.2)
Chloride: 103 mmol/L (ref 96–106)
Creatinine, Ser: 0.64 mg/dL (ref 0.57–1.00)
Globulin, Total: 3 g/dL (ref 1.5–4.5)
Glucose: 77 mg/dL (ref 70–99)
Potassium: 4.3 mmol/L (ref 3.5–5.2)
Sodium: 142 mmol/L (ref 134–144)
Total Protein: 7.4 g/dL (ref 6.0–8.5)
eGFR: 112 mL/min/{1.73_m2} (ref >59)

## 2023-04-26 LAB — THYROID PANEL WITH TSH
Free Thyroxine Index: 2.4 (ref 1.2–4.9)
T3 Uptake Ratio: 29 % (ref 24–39)
T4, Total: 8.2 ug/dL (ref 4.5–12.0)
TSH: 0.702 u[IU]/mL (ref 0.450–4.500)

## 2023-05-06 ENCOUNTER — Ambulatory Visit (HOSPITAL_COMMUNITY)
Admission: RE | Admit: 2023-05-06 | Discharge: 2023-05-06 | Disposition: A | Discharge status: Home / Self Care | Payer: No Typology Code available for payment source | Source: Ambulatory Visit | Attending: Family Medicine

## 2023-05-06 DIAGNOSIS — E041 Nontoxic single thyroid nodule: Secondary | ICD-10-CM | POA: Insufficient documentation

## 2023-08-02 NOTE — Progress Notes (Deleted)
[address] [place]  [place] [address] Dept: [phone]  FOLLOW UP NOTE  Patient ID: DEMOND SAHM, female    DOB: Apr 02, 1981  Age: 42 y.o. MRN: [id number] Date of Office Visit: 08/03/2023  Assessment  Chief Complaint: No chief complaint on file.  HPI [name]    Drug Allergies:  Allergies  Allergen Reactions   Latex Anaphylaxis    Blisters    Rocephin [Ceftriaxone] Anaphylaxis   Oxycodone Nausea And Vomiting   Wellbutrin [Bupropion]     Headaches    Physical Exam: LMP 07/02/2020    Physical Exam  Diagnostics:    Assessment and Plan: No diagnosis found.  No orders of the defined types were placed in this encounter.   There are no Patient Instructions on file for this visit.  No follow-ups on file.    Thank you for the opportunity to care for this patient.  Please do not hesitate to contact me with questions.  Thermon Leyland, FNP [facility]

## 2023-08-03 ENCOUNTER — Ambulatory Visit: Payer: Commercial Managed Care - PPO | Admitting: Family Medicine

## 2023-08-04 ENCOUNTER — Other Ambulatory Visit: Payer: Self-pay | Admitting: Family Medicine

## 2023-08-18 DIAGNOSIS — M25562 Pain in left knee: Secondary | ICD-10-CM | POA: Diagnosis not present

## 2023-08-26 ENCOUNTER — Ambulatory Visit: Payer: Commercial Managed Care - PPO | Admitting: Family Medicine

## 2023-08-26 ENCOUNTER — Encounter: Payer: Self-pay | Admitting: Family Medicine

## 2023-08-26 VITALS — BP 126/81 | HR 60 | Ht 65.0 in | Wt 225.0 lb

## 2023-08-26 DIAGNOSIS — K219 Gastro-esophageal reflux disease without esophagitis: Secondary | ICD-10-CM

## 2023-08-26 DIAGNOSIS — Z8639 Personal history of other endocrine, nutritional and metabolic disease: Secondary | ICD-10-CM | POA: Diagnosis not present

## 2023-08-26 DIAGNOSIS — K760 Fatty (change of) liver, not elsewhere classified: Secondary | ICD-10-CM | POA: Diagnosis not present

## 2023-08-26 MED ORDER — OMEPRAZOLE 20 MG PO CPDR
DELAYED_RELEASE_CAPSULE | ORAL | 3 refills | Status: DC
Start: 2023-08-26 — End: 2024-06-07

## 2023-08-26 MED ORDER — IBUPROFEN 800 MG PO TABS: 800.0000 mg | ORAL_TABLET | Freq: Four times a day (QID) | ORAL | 3 refills | Status: AC | PRN

## 2023-08-26 NOTE — Progress Notes (Signed)
BP 126/81   Pulse 60   Ht 5\' 5"  (1.651 m)   Wt 225 lb (102.1 kg)   LMP 07/02/2020   SpO2 97%   BMI 37.44 kg/m    Subjective:   Patient ID: Caroline Hopkins, female    DOB: [birth year], 42 y.o.   MRN: [id number]  HPI: Caroline Hopkins is a 42 y.o. female presenting on 08/26/2023 for Medical Management of Chronic Issues   HPI GERD Patient is currently on omeprazole as needed.  She denies any major symptoms or abdominal pain or belching or burping. She denies any blood in her stool or lightheadedness or dizziness.   Fatty liver and obesity Patient is coming in for fatty liver and obesity recheck.  Will do some blood work today.  History of vitamin D deficiency. Patient is coming in for history of vitamin D deficiency recheck.   Relevant past medical, surgical, family and social history reviewed and updated as indicated. Interim medical history since our last visit reviewed. Allergies and medications reviewed and updated.  Review of Systems  Constitutional:  Negative for chills and fever.  Eyes:  Negative for visual disturbance.  Respiratory:  Negative for chest tightness and shortness of breath.   Cardiovascular:  Negative for chest pain and leg swelling.  Musculoskeletal:  Negative for back pain and gait problem.  Skin:  Negative for rash.  Neurological:  Negative for dizziness, light-headedness and headaches.  Psychiatric/Behavioral:  Negative for agitation and behavioral problems.   All other systems reviewed and are negative.   Per HPI unless specifically indicated above   Allergies as of 08/26/2023       Reactions   Latex Anaphylaxis   Blisters    Rocephin [ceftriaxone] Anaphylaxis   Oxycodone Nausea And Vomiting   Wellbutrin [bupropion]    Headaches        Medication List        Accurate as of August 26, 2023 10:45 AM. If you have any questions, ask your nurse or doctor.          ibuprofen 800 MG tablet Commonly known as: ADVIL Take 1 tablet  (800 mg total) by mouth every 6 (six) hours as needed.   omeprazole 20 MG capsule Commonly known as: PRILOSEC Take one capsule each day   Opzelura 1.5 % Crea Generic drug: Ruxolitinib Phosphate Apply 1 application  topically 2 (two) times daily as needed.         Objective:   BP 126/81   Pulse 60   Ht 5\' 5"  (1.651 m)   Wt 225 lb (102.1 kg)   LMP 07/02/2020   SpO2 97%   BMI 37.44 kg/m   Wt Readings from Last 3 Encounters:  08/26/23 225 lb (102.1 kg)  04/25/23 227 lb (103 kg)  04/08/23 220 lb (99.8 kg)    Physical Exam Vitals and nursing note reviewed.  Constitutional:      General: She is not in acute distress.    Appearance: She is well-developed. She is not diaphoretic.  Eyes:     Conjunctiva/sclera: Conjunctivae normal.  Cardiovascular:     Rate and Rhythm: Normal rate and regular rhythm.     Heart sounds: Normal heart sounds. No murmur heard. Pulmonary:     Effort: Pulmonary effort is normal. No respiratory distress.     Breath sounds: Normal breath sounds. No wheezing.  Musculoskeletal:        General: No swelling. Normal range of motion.  Skin:    General:  Skin is warm and dry.     Findings: No rash.  Neurological:     Mental Status: She is alert and oriented to person, place, and time.     Coordination: Coordination normal.  Psychiatric:        Behavior: Behavior normal.       Assessment & Plan:   Problem List Items Addressed This Visit       Digestive   GERD (gastroesophageal reflux disease)   Relevant Medications   omeprazole (PRILOSEC) 20 MG capsule   Other Relevant Orders   CBC with Differential/Platelet   CMP14+EGFR   Lipid panel   TSH   Fatty liver - Primary   Relevant Orders   CBC with Differential/Platelet   CMP14+EGFR   Lipid panel     Other   H/O vitamin D deficiency   Relevant Orders   VITAMIN D 25 Hydroxy (Vit-D Deficiency, Fractures)    Will do blood work today, continue current medicine, come back in 6 months for  physical. Follow up plan: Return in about 6 months (around 02/24/2024), or if symptoms worsen or fail to improve, for Physical exam.  Counseling provided for all of the vaccine components Orders Placed This Encounter  Procedures   CBC with Differential/Platelet   CMP14+EGFR   Lipid panel   TSH   VITAMIN D 25 Hydroxy (Vit-D Deficiency, Fractures)    Arville Care, MD [facility] 08/26/2023, 10:45 AM

## 2023-08-27 LAB — CBC WITH DIFFERENTIAL/PLATELET
Basophils Absolute: 0 10*3/uL (ref 0.0–0.2)
Basos: 1 %
EOS (ABSOLUTE): 0.1 10*3/uL (ref 0.0–0.4)
Eos: 1 %
Hematocrit: 38.1 % (ref 34.0–46.6)
Hemoglobin: 12.6 g/dL (ref 11.1–15.9)
Immature Grans (Abs): 0 10*3/uL (ref 0.0–0.1)
Immature Granulocytes: 0 %
Lymphocytes Absolute: 1.7 10*3/uL (ref 0.7–3.1)
Lymphs: 21 %
MCH: 30.4 pg (ref 26.6–33.0)
MCHC: 33.1 g/dL (ref 31.5–35.7)
MCV: 92 fL (ref 79–97)
Monocytes Absolute: 0.5 10*3/uL (ref 0.1–0.9)
Monocytes: 6 %
Neutrophils Absolute: 6.1 10*3/uL (ref 1.4–7.0)
Neutrophils: 71 %
Platelets: 280 10*3/uL (ref 150–450)
RBC: 4.15 x10E6/uL (ref 3.77–5.28)
RDW: 12.9 % (ref 11.7–15.4)
WBC: 8.4 10*3/uL (ref 3.4–10.8)

## 2023-08-27 LAB — LIPID PANEL
Chol/HDL Ratio: 3.3 {ratio} (ref 0.0–4.4)
Cholesterol, Total: 154 mg/dL (ref 100–199)
HDL: 47 mg/dL (ref >39)
LDL Chol Calc (NIH): 90 mg/dL (ref 0–99)
Triglycerides: 91 mg/dL (ref 0–149)
VLDL Cholesterol Cal: 17 mg/dL (ref 5–40)

## 2023-08-27 LAB — CMP14+EGFR
ALT: 16 [IU]/L (ref 0–32)
AST: 16 [IU]/L (ref 0–40)
Albumin: 4.2 g/dL (ref 3.9–4.9)
Alkaline Phosphatase: 65 [IU]/L (ref 44–121)
BUN/Creatinine Ratio: 21 (ref 9–23)
BUN: 14 mg/dL (ref 6–24)
Bilirubin Total: 0.5 mg/dL (ref 0.0–1.2)
CO2: 25 mmol/L (ref 20–29)
Calcium: 9.2 mg/dL (ref 8.7–10.2)
Chloride: 101 mmol/L (ref 96–106)
Creatinine, Ser: 0.68 mg/dL (ref 0.57–1.00)
Globulin, Total: 3 g/dL (ref 1.5–4.5)
Glucose: 67 mg/dL — ABNORMAL LOW (ref 70–99)
Potassium: 4.6 mmol/L (ref 3.5–5.2)
Sodium: 141 mmol/L (ref 134–144)
Total Protein: 7.2 g/dL (ref 6.0–8.5)
eGFR: 111 mL/min/{1.73_m2} (ref >59)

## 2023-08-27 LAB — TSH: TSH: 0.721 u[IU]/mL (ref 0.450–4.500)

## 2023-10-13 ENCOUNTER — Encounter: Payer: Self-pay | Admitting: Family Medicine

## 2023-10-20 ENCOUNTER — Ambulatory Visit: Payer: Commercial Managed Care - PPO | Admitting: Family Medicine

## 2023-10-23 DIAGNOSIS — M25562 Pain in left knee: Secondary | ICD-10-CM | POA: Diagnosis not present

## 2023-10-26 DIAGNOSIS — M222X2 Patellofemoral disorders, left knee: Secondary | ICD-10-CM | POA: Diagnosis not present

## 2023-11-16 DIAGNOSIS — M25562 Pain in left knee: Secondary | ICD-10-CM | POA: Diagnosis not present

## 2023-11-23 DIAGNOSIS — M222X2 Patellofemoral disorders, left knee: Secondary | ICD-10-CM | POA: Diagnosis not present

## 2023-11-23 DIAGNOSIS — M25562 Pain in left knee: Secondary | ICD-10-CM | POA: Diagnosis not present

## 2023-12-01 DIAGNOSIS — M25562 Pain in left knee: Secondary | ICD-10-CM | POA: Diagnosis not present

## 2023-12-09 DIAGNOSIS — M25562 Pain in left knee: Secondary | ICD-10-CM | POA: Diagnosis not present

## 2023-12-25 ENCOUNTER — Other Ambulatory Visit: Payer: Self-pay

## 2023-12-25 ENCOUNTER — Emergency Department (HOSPITAL_COMMUNITY): Payer: Commercial Managed Care - PPO

## 2023-12-25 ENCOUNTER — Encounter (HOSPITAL_COMMUNITY): Payer: Self-pay | Admitting: *Deleted

## 2023-12-25 ENCOUNTER — Emergency Department (HOSPITAL_COMMUNITY)
Admission: EM | Admit: 2023-12-25 | Discharge: 2023-12-26 | Disposition: A | Discharge status: Home / Self Care | Payer: Commercial Managed Care - PPO | Attending: Emergency Medicine | Admitting: Emergency Medicine

## 2023-12-25 DIAGNOSIS — Z91040 Latex allergy status: Secondary | ICD-10-CM | POA: Diagnosis not present

## 2023-12-25 DIAGNOSIS — N200 Calculus of kidney: Secondary | ICD-10-CM | POA: Diagnosis not present

## 2023-12-25 DIAGNOSIS — R109 Unspecified abdominal pain: Secondary | ICD-10-CM

## 2023-12-25 DIAGNOSIS — K760 Fatty (change of) liver, not elsewhere classified: Secondary | ICD-10-CM | POA: Diagnosis not present

## 2023-12-25 DIAGNOSIS — R509 Fever, unspecified: Secondary | ICD-10-CM | POA: Insufficient documentation

## 2023-12-25 DIAGNOSIS — K5730 Diverticulosis of large intestine without perforation or abscess without bleeding: Secondary | ICD-10-CM | POA: Diagnosis not present

## 2023-12-25 DIAGNOSIS — R1011 Right upper quadrant pain: Secondary | ICD-10-CM | POA: Diagnosis not present

## 2023-12-25 DIAGNOSIS — R110 Nausea: Secondary | ICD-10-CM | POA: Diagnosis not present

## 2023-12-25 LAB — COMPREHENSIVE METABOLIC PANEL
ALT: 17 U/L (ref 0–44)
AST: 20 U/L (ref 15–41)
Albumin: 4 g/dL (ref 3.5–5.0)
Alkaline Phosphatase: 43 U/L (ref 38–126)
Anion gap: 11 (ref 5–15)
BUN: 7 mg/dL (ref 6–20)
CO2: 27 mmol/L (ref 22–32)
Calcium: 9.4 mg/dL (ref 8.9–10.3)
Chloride: 104 mmol/L (ref 98–111)
Creatinine, Ser: 0.59 mg/dL (ref 0.44–1.00)
GFR, Estimated: >60 mL/min (ref >60)
Glucose, Bld: 82 mg/dL (ref 70–99)
Potassium: 3.2 mmol/L — ABNORMAL LOW (ref 3.5–5.1)
Sodium: 142 mmol/L (ref 135–145)
Total Bilirubin: 0.8 mg/dL (ref 0.0–1.2)
Total Protein: 7.6 g/dL (ref 6.5–8.1)

## 2023-12-25 LAB — CBC
HCT: 39.3 % (ref 36.0–46.0)
Hemoglobin: 12.9 g/dL (ref 12.0–15.0)
MCH: 30.4 pg (ref 26.0–34.0)
MCHC: 32.8 g/dL (ref 30.0–36.0)
MCV: 92.5 fL (ref 80.0–100.0)
Platelets: 230 10*3/uL (ref 150–400)
RBC: 4.25 MIL/uL (ref 3.87–5.11)
RDW: 12.3 % (ref 11.5–15.5)
WBC: 6.6 10*3/uL (ref 4.0–10.5)
nRBC: 0 % (ref 0.0–0.2)

## 2023-12-25 LAB — URINALYSIS, ROUTINE W REFLEX MICROSCOPIC
Bilirubin Urine: NEGATIVE
Glucose, UA: NEGATIVE mg/dL
Hgb urine dipstick: NEGATIVE
Ketones, ur: NEGATIVE mg/dL
Leukocytes,Ua: NEGATIVE
Nitrite: NEGATIVE
Protein, ur: NEGATIVE mg/dL
Specific Gravity, Urine: 1.008 (ref 1.005–1.030)
pH: 7 (ref 5.0–8.0)

## 2023-12-25 LAB — LIPASE, BLOOD: Lipase: 43 U/L (ref 11–51)

## 2023-12-25 LAB — HCG, QUANTITATIVE, PREGNANCY: hCG, Beta Chain, Quant, S: 2 m[IU]/mL (ref <5)

## 2023-12-25 MED ORDER — ONDANSETRON HCL 4 MG/2ML IJ SOLN
4.0000 mg | Freq: Once | INTRAMUSCULAR | Status: AC
  Administered 2023-12-25: 4 mg via INTRAVENOUS
  Filled 2023-12-25: qty 2

## 2023-12-25 MED ORDER — ONDANSETRON 4 MG PO TBDP: 4.0000 mg | ORAL_TABLET | Freq: Three times a day (TID) | ORAL | 0 refills | Status: DC | PRN

## 2023-12-25 MED ORDER — MORPHINE SULFATE (PF) 4 MG/ML IV SOLN
4.0000 mg | Freq: Once | INTRAVENOUS | Status: AC
  Administered 2023-12-25: 4 mg via INTRAVENOUS
  Filled 2023-12-25: qty 1

## 2023-12-25 MED ORDER — KETOROLAC TROMETHAMINE 15 MG/ML IJ SOLN
15.0000 mg | Freq: Once | INTRAMUSCULAR | Status: AC
  Administered 2023-12-25: 15 mg via INTRAVENOUS
  Filled 2023-12-25: qty 1

## 2023-12-25 MED ORDER — ONDANSETRON 4 MG PO TBDP: 4.0000 mg | ORAL_TABLET | Freq: Three times a day (TID) | ORAL | 0 refills | Status: AC | PRN

## 2023-12-25 MED ORDER — IOHEXOL 300 MG/ML  SOLN
100.0000 mL | Freq: Once | INTRAMUSCULAR | Status: AC | PRN
  Administered 2023-12-25: 100 mL via INTRAVENOUS

## 2023-12-25 NOTE — ED Notes (Signed)
 Patient transported to CT

## 2023-12-25 NOTE — ED Provider Notes (Signed)
 [facility] Provider Note   CSN: [id number] Arrival date & time: 12/25/23  1842     History  Chief Complaint  Patient presents with   Abdominal Pain    Caroline Hopkins is a 43 y.o. female with PMH as listed below who presents with RUQ pain that radiates around to R flank, + fever and chills, + nausea that started last night. No h/o similar. Has h/o renal stones but denies any urinary sxs. Rates her pain 15/10. Boyfriend had N/V/D and fever over the weekend but she didn't have any vomiting/diarrhea.   Past Medical History:  Diagnosis Date   Allergy    seasonal   Anemia    Anxiety    Chiari malformation type I (HCC)    Depression    GERD (gastroesophageal reflux disease)    Headache    History of kidney stones    Pneumonia    every year   Vitamin D deficiency        Home Medications Prior to Admission medications   Medication Sig Start Date End Date Taking? Authorizing Provider  ondansetron (ZOFRAN-ODT) 4 MG disintegrating tablet Take 1 tablet (4 mg total) by mouth every 8 (eight) hours as needed. 12/25/23  Yes Loetta Rough, MD  ibuprofen (ADVIL) 800 MG tablet Take 1 tablet (800 mg total) by mouth every 6 (six) hours as needed. 08/26/23   Dettinger, Elige Radon, MD  omeprazole (PRILOSEC) 20 MG capsule Take one capsule each day 08/26/23   Dettinger, Elige Radon, MD  OPZELURA 1.5 % CREA Apply 1 application  topically 2 (two) times daily as needed. 05/21/22   Alfonse Spruce, MD      Allergies    Latex, Rocephin [ceftriaxone], Oxycodone, and Wellbutrin [bupropion]    Review of Systems   Review of Systems A 10 point review of systems was performed and is negative unless otherwise reported in HPI.  Physical Exam Updated Vital Signs BP (!) 153/84   Pulse (!) 55   Temp 98.2 F (36.8 C) (Oral)   Resp 18   Ht 5\' 5"  (1.651 m)   Wt 83.9 kg   LMP 07/02/2020   SpO2 99%   BMI 30.79 kg/m  Physical Exam General: Normal  appearing female, lying in bed.  HEENT: Sclera anicteric, MMM, trachea midline.  Cardiology: RRR, no murmurs/rubs/gallops. Resp: Normal respiratory rate and effort. CTAB, no wheezes, rhonchi, crackles.  Abd: RUQ TTP with + murphy's sign. Soft, non-distended. No rebound tenderness or guarding.  GU: Deferred. MSK: No peripheral edema or signs of trauma. Extremities without deformity or TTP. No cyanosis or clubbing. Skin: warm, dry. Back: +R CVA tenderness Neuro: A&Ox4, CNs II-XII grossly intact. MAEs. Sensation grossly intact.  Psych: Normal mood and affect.   ED Results / Procedures / Treatments   Labs (all labs ordered are listed, but only abnormal results are displayed) Labs Reviewed  COMPREHENSIVE METABOLIC PANEL - Abnormal; Notable for the following components:      Result Value   Potassium 3.2 (*)    All other components within normal limits  LIPASE, BLOOD  CBC  URINALYSIS, ROUTINE W REFLEX MICROSCOPIC    EKG None  Radiology CT ABDOMEN PELVIS W CONTRAST Result Date: 12/25/2023 CLINICAL DATA:  Right upper quadrant pain. EXAM: CT ABDOMEN AND PELVIS WITH CONTRAST TECHNIQUE: Multidetector CT imaging of the abdomen and pelvis was performed using the standard protocol following bolus administration of intravenous contrast. RADIATION DOSE REDUCTION: This exam was performed  according to the departmental dose-optimization program which includes automated exposure control, adjustment of the mA and/or kV according to patient size and/or use of iterative reconstruction technique. CONTRAST:  OMNIPAQUE IOHEXOL 300 MG/ML  SOLN COMPARISON:  CT 04/08/2023 FINDINGS: Lower chest: Clear lung bases. Hepatobiliary: Mild subjective hepatic steatosis. No focal liver abnormality. Gallbladder physiologically distended, no calcified stone. No pericholecystic fat stranding. No biliary dilatation. Pancreas: No ductal dilatation or inflammation. Spleen: Normal in size without focal abnormality.  Adrenals/Urinary Tract: Normal adrenal glands. No hydronephrosis. 3 mm nonobstructing stone in the upper pole of the left kidney. No perinephric fat stranding. No suspicious renal abnormality. Partially distended urinary bladder, normal for degree of distension. Stomach/Bowel: Stomach is within normal limits. Appendix appears normal. No evidence of bowel wall thickening, distention, or inflammatory changes. Minimal left colonic diverticulosis without diverticulitis Vascular/Lymphatic: Normal caliber abdominal aorta. Mild aortic atherosclerosis. The portal vein is patent. No acute vascular findings. No abdominopelvic adenopathy. Reproductive: Status post hysterectomy. No adnexal masses. Other: No ascites or free air. No inguinal or abdominal wall hernia. Musculoskeletal: There are no acute or suspicious osseous abnormalities. IMPRESSION: 1. No acute abnormality in the abdomen/pelvis. 2. Mild hepatic steatosis. 3. Nonobstructing left renal stone. Aortic Atherosclerosis (ICD10-I70.0). Electronically Signed   By: Narda Rutherford M.D.   On: 12/25/2023 22:15    Procedures Procedures    Medications Ordered in ED Medications  ondansetron (ZOFRAN) injection 4 mg (4 mg Intravenous Given 12/25/23 2235)  iohexol (OMNIPAQUE) 300 MG/ML solution 100 mL (100 mLs Intravenous Contrast Given 12/25/23 2150)  morphine (PF) 4 MG/ML injection 4 mg (4 mg Intravenous Given 12/25/23 2245)  ketorolac (TORADOL) 15 MG/ML injection 15 mg (15 mg Intravenous Given 12/25/23 2305)    ED Course/ Medical Decision Making/ A&P                          Medical Decision Making Amount and/or Complexity of Data Reviewed Labs: ordered. Decision-making details documented in ED Course. Radiology: ordered. Decision-making details documented in ED Course.  Risk Prescription drug management.    This patient presents to the ED for concern of right flank pain, right upper quadrant pain, nausea, this involves an extensive number of treatment  options, and is a complaint that carries with it a high risk of complications and morbidity.  I considered the following differential and admission for this acute, potentially life threatening condition.  Patient is overall hemodynamically stable albeit mildly hypertensive and well-appearing, nontoxic-appearing.  MDM:    DDX for RUQ/R flank pain includes but is not limited to:  Highest degree of concern for acute biliary disease such as biliary colic, or cholecystitis given report of subjective fever at home.  She is reassuringly afebrile here with no tachycardia or leukocytosis to indicate serious bacterial infection or sepsis.  Also consider pyelonephritis, nephrolithiasis versus ureterolithiasis.  Considered also pancreatitis but lipase is negative.  Lower c/f vascular causes such as abdominal aortic aneurysm or dissection, renal artery embolism, renal vein thrombosis, or mesenteric ischemia. HCG is negative. UA without UTI.   Reassuringly CT does not demonstrate any acute abnormality.  She does have mild hepatic steatosis which could contribute to her pain but would not expect it to cause such acute pain.  We do not have ultrasound available at this facility now, will offer patient admission for pain control and an ultrasound in the morning.   Clinical Course as of 12/25/23 2320  Sun Dec 25, 2023  2134 Comprehensive metabolic  panel(!) Mildly decreased potassium, otherwise unremarkable [HN]  2135 Lipase: 43 wnl [HN]  2135 CBC wnl [HN]  2135 Urinalysis, Routine w reflex microscopic -Urine, Clean Catch wnl [HN]  2225 CT ABDOMEN PELVIS W CONTRAST 1. No acute abnormality in the abdomen/pelvis. 2. Mild hepatic steatosis. 3. Nonobstructing left renal stone.   [HN]  2316 Patient states that she would like to be discharged. I offered her admission for pain control and RUQ [place] in the AM but patient states that she would like to be discharged and have the ultrasound tomorrow as outpatient. Patient  has reassuring labs and CT imaging. She is HDS and afebrile. No leukocytosis. Believe she can f/u outpatient. Will order RUQ [place] and patient will be DC'd w/ PCP f/u.  [HN]    Clinical Course User Index [HN] Loetta Rough, MD    Labs: I Ordered, and personally interpreted labs.  The pertinent results include: Those listed above  Imaging Studies ordered: I ordered imaging studies including CT abdomen pelvis I independently visualized and interpreted imaging. I agree with the radiologist interpretation  Additional history obtained from chart review.    Reevaluation: After the interventions noted above, I reevaluated the patient and found that they have :improved  Social Determinants of Health:  lives independently  Disposition: DC with ultrasound in the morning  Co morbidities that complicate the patient evaluation  Past Medical History:  Diagnosis Date   Allergy    seasonal   Anemia    Anxiety    Chiari malformation type I (HCC)    Depression    GERD (gastroesophageal reflux disease)    Headache    History of kidney stones    Pneumonia    every year   Vitamin D deficiency      Medicines Meds ordered this encounter  Medications   ondansetron (ZOFRAN) injection 4 mg   iohexol (OMNIPAQUE) 300 MG/ML solution 100 mL   morphine (PF) 4 MG/ML injection 4 mg    Refill:  0   ketorolac (TORADOL) 15 MG/ML injection 15 mg   ondansetron (ZOFRAN-ODT) 4 MG disintegrating tablet    Sig: Take 1 tablet (4 mg total) by mouth every 8 (eight) hours as needed.    Dispense:  20 tablet    Refill:  0    I have reviewed the patients home medicines and have made adjustments as needed  Problem List / ED Course: Problem List Items Addressed This Visit   None Visit Diagnoses       RUQ pain    -  Primary     Right flank pain                       This note was created using dictation software, which may contain spelling or grammatical errors.    Loetta Rough,  MD 12/31/23 [phone]

## 2023-12-25 NOTE — ED Triage Notes (Signed)
 RUQ pain that radiates around to back, + fever and chills, + nausea Started last night.

## 2023-12-25 NOTE — Discharge Instructions (Addendum)
 Thank you for coming to [facility] Emergency Department. You were seen for abdominal and flank pain. We did an exam, labs, and imaging, and these showed no acute findings. We offered you admission but you preferred to be discharged home. Please return here tomorrow in the morning for an ultrasound. We have prescribed zofran under the tongue every 6-8 hours as needed for nausea/vomiting. Please be sure you are able to eat/drink well.   Please follow up with your primary care provider within 1 week.   Do not hesitate to return to the ED or call 911 if you experience: -Worsening symptoms -Inability to eat/drink -Lightheadedness, passing out -Fevers/chills -Anything else that concerns you

## 2023-12-26 ENCOUNTER — Ambulatory Visit: Payer: Self-pay | Admitting: Family Medicine

## 2023-12-26 NOTE — ED Notes (Signed)
 Reviewed D/C information with the patient, pt verbalized understanding. No additional concerns at this time.

## 2023-12-26 NOTE — Telephone Encounter (Signed)
 Copied from CRM [id number]. Topic: Clinical - Red Word Triage >> Dec 26, 2023  2:15 PM Dennison Nancy wrote: Red Word that prompted transfer to Nurse Triage: call to make an Emergency room followup still having right upper pain radiating to back and nausea  found kidney stones and pain  Chief Complaint: Kidney stone Symptoms: Flank pain Frequency: 2 days Pertinent Negatives: Patient denies relief Disposition: [] ED /[] Urgent Care (no appt availability in office) / [x] Appointment(In office/virtual)/ []  [facility]/ [] Home Care/ [] Refused Recommended Disposition /[] [facility]/ []  Follow-up with PCP Additional Notes: Patient called in requesting a HFU with her provider. Patient was in the ED last night and diagnosed with a kidney stone. Patient stated she is still experiencing right flank pain and rated it a 7. Patient stated pain is constant. Patient stated she had abdominal pain, but that has improved. Patient is also experiencing loss of appetite and slight urinary urgency. Patient denied a fever at this time. Per patient chart, the patient's discharge instructions were to schedule a HFU and determine if there is an additional need for an ultrasound. No availability within 2 weeks with PCP. Scheduled patient a HFU with an alternate provider in office. Advised patient to call back if symptoms worsen. Patient complied.   Reason for Disposition  MODERATE pain (e.g., interferes with normal activities or awakens from sleep)  Answer Assessment - Initial Assessment Questions 1. LOCATION: "Where does it hurt?" (e.g., left, right)     Right flank 2. ONSET: "When did the pain start?"     2 days ago, went to ER last night due to pain 3. SEVERITY: "How bad is the pain?" (e.g., Scale 1-10; mild, moderate, or severe)   - MILD (1-3): doesn't interfere with normal activities    - MODERATE (4-7): interferes with normal activities or awakens from sleep    - SEVERE (8-10): excruciating pain and  patient unable to do normal activities (stays in bed)       States pain is back to a 7 4. PATTERN: "Does the pain come and go, or is it constant?"      States pain is steady  5. CAUSE: "What do you think is causing the pain?"     Kidney stone, diagnosed in ED 6. OTHER SYMPTOMS:  "Do you have any other symptoms?" (e.g., fever, abdomen pain, vomiting, leg weakness, burning with urination, blood in urine)     Loss of appetite, denies fever at this time- had one last night, states she has "slight" urgency  Protocols used: Flank Pain-A-AH

## 2023-12-27 ENCOUNTER — Encounter: Payer: Self-pay | Admitting: Nurse Practitioner

## 2023-12-27 ENCOUNTER — Ambulatory Visit: Payer: Commercial Managed Care - PPO | Admitting: Nurse Practitioner

## 2023-12-27 VITALS — BP 124/78 | HR 53 | Temp 97.6°F | Wt 221.0 lb

## 2023-12-27 DIAGNOSIS — N200 Calculus of kidney: Secondary | ICD-10-CM | POA: Diagnosis not present

## 2023-12-27 DIAGNOSIS — A09 Infectious gastroenteritis and colitis, unspecified: Secondary | ICD-10-CM | POA: Diagnosis not present

## 2023-12-27 DIAGNOSIS — R110 Nausea: Secondary | ICD-10-CM

## 2023-12-27 MED ORDER — TAMSULOSIN HCL 0.4 MG PO CAPS: 0.4000 mg | ORAL_CAPSULE | Freq: Every day | ORAL | 3 refills | Status: DC

## 2023-12-27 NOTE — Patient Instructions (Signed)
 Diarrhea, Adult Diarrhea is when you pass loose and sometimes watery poop (stool) often. Diarrhea can make you feel weak and cause you to lose water in your body (get dehydrated). Losing water in your body can cause you to: Feel tired and thirsty. Have a dry mouth. Go pee (urinate) less often. Diarrhea often lasts 2-3 days. It can last longer if it is a sign of something more serious. Be sure to treat your diarrhea as told by your doctor. Follow these instructions at home: Eating and drinking     Follow these instructions as told by your doctor: Take an ORS (oral rehydration solution). This is a drink that helps you replace fluids and minerals your body lost. It is sold at pharmacies and stores. Drink enough fluid to keep your pee (urine) pale yellow. Drink fluids such as: Water. You can also get fluids by sucking on ice chips. Diluted fruit juice. Low-calorie sports drinks. Milk. Avoid drinking fluids that have a lot of sugar or caffeine in them. These include soda, energy drinks, and regular sports drinks. Avoid alcohol. Eat bland, easy-to-digest foods in small amounts as you are able. These foods include: Bananas. Applesauce. Rice. Low-fat (lean) meats. Toast. Crackers. Avoid spicy or fatty foods.  Medicines Take over-the-counter and prescription medicines only as told by your doctor. If you were prescribed antibiotics, take them as told by your doctor. Do not stop taking them even if you start to feel better. General instructions  Wash your hands often using soap and water for 20 seconds. If soap and water are not available, use hand sanitizer. Others in your home should wash their hands as well. Wash your hands: After using the toilet or changing a diaper. Before preparing, cooking, or serving food. While caring for a sick person. While visiting someone in a hospital. Rest at home while you get better. Take a warm bath to help with any burning or pain from having  diarrhea. Watch your condition for any changes. Contact a doctor if: You have a fever. Your diarrhea gets worse. You have new symptoms. You vomit every time you eat or drink. You feel light-headed, dizzy, or you have a headache. You have muscle cramps. You have signs of losing too much water in your body, such as: Dark pee, very little pee, or no pee. Cracked lips. Dry mouth. Sunken eyes. Sleepiness. Weakness. You have bloody or black poop or poop that looks like tar. You have very bad pain, cramping, or bloating in your belly (abdomen). Your skin feels cold and clammy. You feel confused. Get help right away if: You have chest pain. Your heart is beating very quickly. You have trouble breathing or you are breathing very quickly. You feel very weak or you faint. These symptoms may be an emergency. Get help right away. Call 911. Do not wait to see if the symptoms will go away. Do not drive yourself to the hospital. This information is not intended to replace advice given to you by your health care provider. Make sure you discuss any questions you have with your health care provider. Document Revised: 04/13/2022 Document Reviewed: 04/13/2022 Elsevier Patient Education  2024 [employer].

## 2023-12-27 NOTE — Progress Notes (Signed)
   Subjective:    Patient ID: Caroline Hopkins, female    DOB: [birth year], 43 y.o.   MRN: [id number]   Chief Complaint: Nephrolithiasis (Pain in back ER ) and GI Problem (Boyfriend has stomach bug )   GI Problem The primary symptoms include fever. Primary symptoms do not include dysuria.  The illness does not include chills.   Patient was seen in ED and was dx with kidney stone. Still having pain and blood in urine. She has had kidney stones in the past. She also has nausea now with some diarrhea. Patient Active Problem List   Diagnosis Date Noted   Seasonal and perennial allergic rhinitis 06/13/2022   Intrinsic atopic dermatitis 06/13/2022   Insomnia 03/31/2018   IUD (intrauterine device) in place 04/27/2017   Menorrhagia with regular cycle 02/27/2016   H/O vitamin D deficiency 02/27/2016   GERD (gastroesophageal reflux disease) 01/02/2016   Arnold-Chiari syndrome (HCC) 10/30/2015   Depression, recurrent (HCC) 10/30/2015   Obesity, unspecified 12/07/2007   IRRITABLE BOWEL SYNDROME 12/07/2007   Fatty liver 12/07/2007   NEPHROLITHIASIS, HX OF 12/07/2007       Review of Systems  Constitutional:  Positive for fever. Negative for chills.  Genitourinary:  Positive for flank pain, frequency and urgency. Negative for dysuria.       Objective:   Physical Exam Constitutional:      Appearance: Normal appearance. She is obese.  Cardiovascular:     Rate and Rhythm: Normal rate and regular rhythm.  Pulmonary:     Effort: Pulmonary effort is normal.     Breath sounds: Normal breath sounds.  Abdominal:     Tenderness: There is no abdominal tenderness. There is right CVA tenderness.  Neurological:     Mental Status: She is alert.    BP 124/78   Pulse (!) 53   Temp 97.6 F (36.4 C) (Temporal)   Wt 221 lb (100.2 kg)   LMP 07/02/2020   SpO2 100%   BMI 36.78 kg/m         Assessment & Plan:   [name] in today with chief complaint of Nephrolithiasis (Pain in back  ER ) and GI Problem (Boyfriend has stomach bug )   1. Nephrolithiasis (Primary) Force fluids - tamsulosin (FLOMAX) 0.4 MG CAPS capsule; Take 1 capsule (0.4 mg total) by mouth daily.  Dispense: 30 capsule; Refill: 3  2. Nausea Zofran as prescribed by ED Parke Simmers diet  3. Diarrhea of infectious origin Imodium AD otc Force fluids    The above assessment and management plan was discussed with the patient. The patient verbalized understanding of and has agreed to the management plan. Patient is aware to call the clinic if symptoms persist or worsen. Patient is aware when to return to the clinic for a follow-up visit. Patient educated on when it is appropriate to go to the emergency department.   Mary-Margaret Daphine Deutscher, FNP

## 2024-02-23 ENCOUNTER — Other Ambulatory Visit: Payer: Self-pay | Admitting: Nurse Practitioner

## 2024-02-23 DIAGNOSIS — N200 Calculus of kidney: Secondary | ICD-10-CM

## 2024-05-31 NOTE — Progress Notes (Unsigned)
   [address] [place] [address] Dept: [phone]  FOLLOW UP NOTE  Patient ID: Caroline Hopkins, female    DOB: December 29, 1980  Age: 43 y.o. MRN: [id number] Date of Office Visit: 06/01/2024  Assessment  Chief Complaint: No chief complaint on file.  HPI Caroline Hopkins is a 43 year old female who presents to clinic for follow-up visit.  She was last seen in this clinic on 06/11/2022 by Arlean Mutter, FNP, for evaluation of allergic rhinitis and atopic dermatitis.   Her last environmental allergy skin testing was on 06/26/2021 and was positive to grass pollen, indoor molds, outdoor molds, dust mite, cat, and dog.  Discussed the use of AI scribe software for clinical note transcription with the patient, who gave verbal consent to proceed.  History of Present Illness      Drug Allergies:  Allergies  Allergen Reactions   Latex Anaphylaxis    Blisters    Rocephin [Ceftriaxone] Anaphylaxis   Oxycodone  Nausea And Vomiting   Wellbutrin  [Bupropion ]     Headaches    Physical Exam: LMP 07/02/2020    Physical Exam  Diagnostics:    Assessment and Plan: No diagnosis found.  No orders of the defined types were placed in this encounter.   There are no Patient Instructions on file for this visit.  No follow-ups on file.    Thank you for the opportunity to care for this patient.  Please do not hesitate to contact me with questions.  Arlean Mutter, FNP [facility]

## 2024-05-31 NOTE — Patient Instructions (Incomplete)
Allergic rhinitis Continue allergen avoidance measures directed toward pollen, mold, dust mite, and pets as listed below Continue Xyzal 2.5-5 mg once a day as needed for runny nose or itch Consider saline nasal rinses as needed for nasal symptoms. Use this before any medicated nasal sprays for best result If your symptoms are not well controlled with the treatment plan as listed above, consider allergen immunotherapy  Atopic dermatitis Continue with the twice a day moisturizing routine Continue Opzulura up to twice a day to red and itchy areas.  This medication does not contain a steroid and is safe to use on sensitive areas  Call the clinic if this treatment plan is not working well for you.  Follow up in 1 year or sooner if needed.  Reducing Pollen Exposure The American Academy of Allergy, Asthma and Immunology suggests the following steps to reduce your exposure to pollen during allergy seasons. Do not hang sheets or clothing out to dry; pollen may collect on these items. Do not mow lawns or spend time around freshly cut grass; mowing stirs up pollen. Keep windows closed at night.  Keep car windows closed while driving. Minimize morning activities outdoors, a time when pollen counts are usually at their highest. Stay indoors as much as possible when pollen counts or humidity is high and on windy days when pollen tends to remain in the air longer. Use air conditioning when possible.  Many air conditioners have filters that trap the pollen spores. Use a HEPA room air filter to remove pollen form the indoor air you breathe.  Control of Mold Allergen Mold and fungi can grow on a variety of surfaces provided certain temperature and moisture conditions exist.  Outdoor molds grow on plants, decaying vegetation and soil.  The major outdoor mold, Alternaria and Cladosporium, are found in very high numbers during hot and dry conditions.  Generally, a late Summer - Fall peak is seen for common  outdoor fungal spores.  Rain will temporarily lower outdoor mold spore count, but counts rise rapidly when the rainy period ends.  The most important indoor molds are Aspergillus and Penicillium.  Dark, humid and poorly ventilated basements are ideal sites for mold growth.  The next most common sites of mold growth are the bathroom and the kitchen.  Outdoor [employer] Use air conditioning and keep windows closed Avoid exposure to decaying vegetation. Avoid leaf raking. Avoid grain handling. Consider wearing a face mask if working in moldy areas.  Indoor Mold Control Maintain humidity below 50%. Clean washable surfaces with 5% bleach solution. Remove sources e.g. Contaminated carpets.   Control of Dust Mite Allergen Dust mites play a major role in allergic asthma and rhinitis. They occur in environments with high humidity wherever human skin is found. Dust mites absorb humidity from the atmosphere (ie, they do not drink) and feed on organic matter (including shed human and animal skin). Dust mites are a microscopic type of insect that you cannot see with the naked eye. High levels of dust mites have been detected from mattresses, pillows, carpets, upholstered furniture, bed covers, clothes, soft toys and any woven material. The principal allergen of the dust mite is found in its feces. A gram of dust may contain 1,000 mites and 250,000 fecal particles. Mite antigen is easily measured in the air during house cleaning activities. Dust mites do not bite and do not cause harm to humans, other than by triggering allergies/asthma.  Ways to decrease your exposure to dust mites in your home:  1. Encase mattresses, box springs and pillows with a mite-impermeable barrier or cover  2. Wash sheets, blankets and drapes weekly in hot water (130 F) with detergent and dry them in a dryer on the hot setting.  3. Have the room cleaned frequently with a vacuum cleaner and a damp dust-mop. For carpeting or  rugs, vacuuming with a vacuum cleaner equipped with a high-efficiency particulate air (HEPA) filter. The dust mite allergic individual should not be in a room which is being cleaned and should wait 1 hour after cleaning before going into the room.  4. Do not sleep on upholstered furniture (eg, couches).  5. If possible removing carpeting, upholstered furniture and drapery from the home is ideal. Horizontal blinds should be eliminated in the rooms where the person spends the most time (bedroom, study, television room). Washable vinyl, roller-type shades are optimal.  6. Remove all non-washable stuffed toys from the bedroom. Wash stuffed toys weekly like sheets and blankets above.  7. Reduce indoor humidity to less than 50%. Inexpensive humidity monitors can be purchased at most hardware stores. Do not use a humidifier as can make the problem worse and are not recommended.  Control of Dog or Cat Allergen Avoidance is the best way to manage a dog or cat allergy. If you have a dog or cat and are allergic to dog or cats, consider removing the dog or cat from the home. If you have a dog or cat but don't want to find it a new home, or if your family wants a pet even though someone in the household is allergic, here are some strategies that may help keep symptoms at bay:  Keep the pet out of your bedroom and restrict it to only a few rooms. Be advised that keeping the dog or cat in only one room will not limit the allergens to that room. Don't pet, hug or kiss the dog or cat; if you do, wash your hands with soap and water. High-efficiency particulate air (HEPA) cleaners run continuously in a bedroom or living room can reduce allergen levels over time. Regular use of a high-efficiency vacuum cleaner or a central vacuum can reduce allergen levels. Giving your dog or cat a bath at least once a week can reduce airborne allergen.

## 2024-06-01 ENCOUNTER — Encounter: Payer: Self-pay | Admitting: Family Medicine

## 2024-06-01 ENCOUNTER — Ambulatory Visit: Admitting: Family Medicine

## 2024-06-01 ENCOUNTER — Other Ambulatory Visit: Payer: Self-pay

## 2024-06-01 VITALS — BP 128/80 | HR 92 | Temp 98.4°F | Resp 18 | Ht 65.0 in | Wt 228.0 lb

## 2024-06-01 DIAGNOSIS — J3089 Other allergic rhinitis: Secondary | ICD-10-CM | POA: Diagnosis not present

## 2024-06-01 DIAGNOSIS — H1013 Acute atopic conjunctivitis, bilateral: Secondary | ICD-10-CM

## 2024-06-01 DIAGNOSIS — J302 Other seasonal allergic rhinitis: Secondary | ICD-10-CM

## 2024-06-01 DIAGNOSIS — H1010 Acute atopic conjunctivitis, unspecified eye: Secondary | ICD-10-CM

## 2024-06-01 DIAGNOSIS — L2084 Intrinsic (allergic) eczema: Secondary | ICD-10-CM | POA: Diagnosis not present

## 2024-06-01 MED ORDER — OPZELURA 1.5 % EX CREA: 1.0000 | TOPICAL_CREAM | Freq: Two times a day (BID) | CUTANEOUS | 5 refills | Status: AC | PRN

## 2024-06-04 ENCOUNTER — Other Ambulatory Visit (HOSPITAL_COMMUNITY): Payer: Self-pay

## 2024-06-04 ENCOUNTER — Telehealth: Payer: Self-pay

## 2024-06-04 NOTE — Telephone Encounter (Signed)
 Pharmacy Patient Advocate Encounter  Received notification from [facility] that Prior Authorization for Opzelura  1.5% cream  has been APPROVED from 06/04/2024 to 09/04/2024. Ran test claim, Copay is $0.00. This test claim was processed through [facility]- copay amounts may vary at other pharmacies due to pharmacy/plan contracts, or as the patient moves through the different stages of their insurance plan.

## 2024-06-04 NOTE — Telephone Encounter (Signed)
*  AA  Pharmacy Patient Advocate Encounter   Received notification from CoverMyMeds that prior authorization for Opzelura  1.5% cream  is required/requested.   Insurance verification completed.   The patient is insured through [facility] .   Per test claim: PA required; PA started via CoverMyMeds. KEY [id number] . Waiting for clinical questions to populate.

## 2024-06-04 NOTE — Telephone Encounter (Signed)
 Clinical questions populated and sent to plan. Pending determination.

## 2024-06-07 ENCOUNTER — Encounter: Payer: Self-pay | Admitting: Family Medicine

## 2024-06-07 ENCOUNTER — Ambulatory Visit: Admitting: Family Medicine

## 2024-06-07 VITALS — BP 127/84 | HR 57 | Ht 65.0 in | Wt 226.0 lb

## 2024-06-07 DIAGNOSIS — Z0001 Encounter for general adult medical examination with abnormal findings: Secondary | ICD-10-CM

## 2024-06-07 DIAGNOSIS — Z0000 Encounter for general adult medical examination without abnormal findings: Secondary | ICD-10-CM | POA: Diagnosis not present

## 2024-06-07 DIAGNOSIS — R5383 Other fatigue: Secondary | ICD-10-CM | POA: Diagnosis not present

## 2024-06-07 DIAGNOSIS — K219 Gastro-esophageal reflux disease without esophagitis: Secondary | ICD-10-CM

## 2024-06-07 DIAGNOSIS — Z1231 Encounter for screening mammogram for malignant neoplasm of breast: Secondary | ICD-10-CM

## 2024-06-07 LAB — LIPID PANEL

## 2024-06-07 MED ORDER — MELOXICAM 15 MG PO TABS: 15.0000 mg | ORAL_TABLET | Freq: Every day | ORAL | 3 refills | Status: DC

## 2024-06-07 MED ORDER — OMEPRAZOLE 20 MG PO CPDR: DELAYED_RELEASE_CAPSULE | ORAL | 3 refills | Status: DC

## 2024-06-07 NOTE — Progress Notes (Signed)
 BP 127/84   Pulse (!) 57   Ht 5' 5 (1.651 m)   Wt 226 lb (102.5 kg)   LMP 07/02/2020   SpO2 96%   BMI 37.61 kg/m    Subjective:   Patient ID: Caroline Hopkins, female    DOB: 1981-04-22, 43 y.o.   MRN: [id number]  HPI: Caroline Hopkins is a 43 y.o. female presenting on 06/07/2024 for Medical Management of Chronic Issues (CPE no pap)   HPI Physical exam Patient denies any chest pain, shortness of breath, headaches or vision issues, abdominal complaints, diarrhea, nausea, vomiting, or joint issues.  The biggest complaint today is the decreased energy not feeling like she can do as many things.  She seems to do well at work but when she is home she just crashes.  GERD Patient is currently on omeprazole .  She denies any major symptoms or abdominal pain or belching or burping. She denies any blood in her stool or lightheadedness or dizziness.   Relevant past medical, surgical, family and social history reviewed and updated as indicated. Interim medical history since our last visit reviewed. Allergies and medications reviewed and updated.  Review of Systems  Constitutional:  Negative for chills and fever.  HENT:  Negative for congestion, ear discharge, ear pain and tinnitus.   Eyes:  Negative for pain, redness and visual disturbance.  Respiratory:  Negative for cough, chest tightness, shortness of breath and wheezing.   Cardiovascular:  Negative for chest pain, palpitations and leg swelling.  Gastrointestinal:  Negative for abdominal pain, blood in stool, constipation and diarrhea.  Genitourinary:  Negative for difficulty urinating, dysuria and hematuria.  Musculoskeletal:  Negative for back pain, gait problem and myalgias.  Skin:  Negative for rash.  Neurological:  Negative for dizziness, weakness, light-headedness and headaches.  Psychiatric/Behavioral:  Negative for agitation, behavioral problems and suicidal ideas.   All other systems reviewed and are negative.   Per HPI unless  specifically indicated above   Allergies as of 06/07/2024       Reactions   Latex Anaphylaxis   Blisters    Rocephin [ceftriaxone] Anaphylaxis   Oxycodone  Nausea And Vomiting   Wellbutrin  [bupropion ]    Headaches        Medication List        Accurate as of June 07, 2024 10:13 AM. If you have any questions, ask your nurse or doctor.          STOP taking these medications    amoxicillin 500 MG tablet Commonly known as: AMOXIL Stopped by: Fonda LABOR Tevan Marian   tamsulosin  0.4 MG Caps capsule Commonly known as: FLOMAX  Stopped by: Fonda LABOR Jerimiah Wolman       TAKE these medications    ibuprofen  800 MG tablet Commonly known as: ADVIL  Take 1 tablet (800 mg total) by mouth every 6 (six) hours as needed.   meloxicam  15 MG tablet Commonly known as: MOBIC  Take 1 tablet (15 mg total) by mouth daily.   omeprazole  20 MG capsule Commonly known as: PRILOSEC Take one capsule each day   ondansetron  4 MG disintegrating tablet Commonly known as: ZOFRAN -ODT Take 1 tablet (4 mg total) by mouth every 8 (eight) hours as needed.   Opzelura  1.5 % Crea Generic drug: Ruxolitinib Phosphate  Apply 1 Application topically 2 (two) times daily as needed.         Objective:   BP 127/84   Pulse (!) 57   Ht 5' 5 (1.651 m)   Wt 226 lb (  102.5 kg)   LMP 07/02/2020   SpO2 96%   BMI 37.61 kg/m   Wt Readings from Last 3 Encounters:  06/07/24 226 lb (102.5 kg)  06/01/24 228 lb (103.4 kg)  12/27/23 221 lb (100.2 kg)    Physical Exam Vitals and nursing note reviewed.  Constitutional:      General: She is not in acute distress.    Appearance: She is well-developed. She is not diaphoretic.  HENT:     Right Ear: Tympanic membrane and ear canal normal.     Left Ear: Tympanic membrane normal.  Eyes:     Conjunctiva/sclera: Conjunctivae normal.  Cardiovascular:     Rate and Rhythm: Normal rate and regular rhythm.     Heart sounds: Normal heart sounds. No murmur heard. Pulmonary:      Effort: Pulmonary effort is normal. No respiratory distress.     Breath sounds: Normal breath sounds. No wheezing.  Abdominal:     General: Abdomen is flat. Bowel sounds are normal. There is no distension.     Palpations: Abdomen is soft.     Tenderness: There is no abdominal tenderness. There is no guarding or rebound.  Musculoskeletal:        General: No swelling or tenderness. Normal range of motion.  Skin:    General: Skin is warm and dry.     Findings: No rash.  Neurological:     Mental Status: She is alert and oriented to person, place, and time.     Coordination: Coordination normal.  Psychiatric:        Behavior: Behavior normal.       Assessment & Plan:   Problem List Items Addressed This Visit       Digestive   GERD (gastroesophageal reflux disease)   Relevant Medications   omeprazole  (PRILOSEC) 20 MG capsule   Other Relevant Orders   CBC with Differential/Platelet   CMP14+EGFR   Lipid panel   TSH   VITAMIN D  25 Hydroxy (Vit-D Deficiency, Fractures)   Other Visit Diagnoses       Physical exam    -  Primary   Relevant Orders   CBC with Differential/Platelet   CMP14+EGFR   Lipid panel   TSH   VITAMIN D  25 Hydroxy (Vit-D Deficiency, Fractures)     Encounter for screening mammogram for malignant neoplasm of breast       Relevant Orders   MM Digital Screening     Decreased energy       Relevant Orders   CBC with Differential/Platelet   CMP14+EGFR   Lipid panel   TSH   VITAMIN D  25 Hydroxy (Vit-D Deficiency, Fractures)       Will order for mammogram, will also order for blood work.  She seems to be doing well, blood pressure and everything else looks good, her only complaint is the fatigue and we will see if we can find a reason for that. Follow up plan: Return in about 1 year (around 06/07/2025), or if symptoms worsen or fail to improve, for Physical exam.  Counseling provided for all of the vaccine components Orders Placed This Encounter   Procedures   MM Digital Screening   CBC with Differential/Platelet   CMP14+EGFR   Lipid panel   TSH   VITAMIN D  25 Hydroxy (Vit-D Deficiency, Fractures)    Fonda Levins, MD [facility] 06/07/2024, 10:13 AM

## 2024-06-08 LAB — CBC WITH DIFFERENTIAL/PLATELET
Basophils Absolute: 0.1 x10E3/uL (ref 0.0–0.2)
Basos: 1 %
EOS (ABSOLUTE): 0.1 x10E3/uL (ref 0.0–0.4)
Eos: 2 %
Hematocrit: 39.6 % (ref 34.0–46.6)
Hemoglobin: 13 g/dL (ref 11.1–15.9)
Immature Grans (Abs): 0 x10E3/uL (ref 0.0–0.1)
Immature Granulocytes: 0 %
Lymphocytes Absolute: 2 x10E3/uL (ref 0.7–3.1)
Lymphs: 28 %
MCH: 29.8 pg (ref 26.6–33.0)
MCHC: 32.8 g/dL (ref 31.5–35.7)
MCV: 91 fL (ref 79–97)
Monocytes Absolute: 0.4 x10E3/uL (ref 0.1–0.9)
Monocytes: 5 %
Neutrophils Absolute: 4.5 x10E3/uL (ref 1.4–7.0)
Neutrophils: 64 %
Platelets: 257 x10E3/uL (ref 150–450)
RBC: 4.36 x10E6/uL (ref 3.77–5.28)
RDW: 13 % (ref 11.7–15.4)
WBC: 7 x10E3/uL (ref 3.4–10.8)

## 2024-06-08 LAB — CMP14+EGFR
ALT: 15 IU/L (ref 0–32)
AST: 16 IU/L (ref 0–40)
Albumin: 4.3 g/dL (ref 3.9–4.9)
Alkaline Phosphatase: 60 IU/L (ref 44–121)
BUN/Creatinine Ratio: 16 (ref 9–23)
BUN: 9 mg/dL (ref 6–24)
Bilirubin Total: 0.7 mg/dL (ref 0.0–1.2)
CO2: 23 mmol/L (ref 20–29)
Calcium: 9.5 mg/dL (ref 8.7–10.2)
Chloride: 104 mmol/L (ref 96–106)
Creatinine, Ser: 0.58 mg/dL (ref 0.57–1.00)
Globulin, Total: 3 g/dL (ref 1.5–4.5)
Glucose: 79 mg/dL (ref 70–99)
Potassium: 4.4 mmol/L (ref 3.5–5.2)
Sodium: 140 mmol/L (ref 134–144)
Total Protein: 7.3 g/dL (ref 6.0–8.5)
eGFR: 114 mL/min/1.73 (ref >59)

## 2024-06-08 LAB — LIPID PANEL
Cholesterol, Total: 152 mg/dL (ref 100–199)
HDL: 40 mg/dL (ref >39)
LDL CALC COMMENT:: 3.8 ratio (ref 0.0–4.4)
LDL Chol Calc (NIH): 88 mg/dL (ref 0–99)
Triglycerides: 137 mg/dL (ref 0–149)
VLDL Cholesterol Cal: 24 mg/dL (ref 5–40)

## 2024-06-08 LAB — TSH: TSH: 0.785 u[IU]/mL (ref 0.450–4.500)

## 2024-06-08 LAB — VITAMIN D 25 HYDROXY (VIT D DEFICIENCY, FRACTURES): Vit D, 25-Hydroxy: 23.5 ng/mL — AB (ref 30.0–100.0)

## 2024-06-13 ENCOUNTER — Ambulatory Visit: Payer: Self-pay | Admitting: Family Medicine

## 2024-07-13 ENCOUNTER — Other Ambulatory Visit: Payer: Self-pay | Admitting: Medical Genetics

## 2024-07-18 ENCOUNTER — Ambulatory Visit
Admission: RE | Admit: 2024-07-18 | Discharge: 2024-07-18 | Disposition: A | Discharge status: Home / Self Care | Source: Ambulatory Visit | Attending: Family Medicine | Admitting: Family Medicine

## 2024-07-18 DIAGNOSIS — Z1231 Encounter for screening mammogram for malignant neoplasm of breast: Secondary | ICD-10-CM | POA: Diagnosis not present

## 2024-08-28 ENCOUNTER — Telehealth: Payer: Self-pay

## 2024-08-28 NOTE — Telephone Encounter (Unsigned)
 Copied from CRM [id number]. Topic: Clinical - Medical Advice >> Aug 28, 2024  2:32 PM Leonette SQUIBB wrote: Reason for CRM: patient called wanting to know if she can be tested for mold exposure.  [phone]   please leave message

## 2024-08-28 NOTE — Telephone Encounter (Signed)
 Left detailed message making patient aware that there is a mold test that our lab can perform to check patient for mold exposure but patient would need to make an appt to see the provider first and then they would be the one to order the test for patient. Advised patient to call and make an appt if she would like to have this done.

## 2024-09-04 ENCOUNTER — Encounter: Payer: Self-pay | Admitting: Family Medicine

## 2024-09-05 ENCOUNTER — Ambulatory Visit: Admitting: Family Medicine

## 2024-11-03 ENCOUNTER — Other Ambulatory Visit: Payer: Self-pay | Admitting: Family Medicine

## 2024-11-03 DIAGNOSIS — K219 Gastro-esophageal reflux disease without esophagitis: Secondary | ICD-10-CM

## 2024-11-05 ENCOUNTER — Ambulatory Visit: Payer: Self-pay

## 2024-11-05 NOTE — Telephone Encounter (Signed)
Noted  -LS

## 2024-11-05 NOTE — Telephone Encounter (Signed)
 FYI Only or Action Required?: FYI only for provider: appointment scheduled on 12.31.25.  Patient was last seen in primary care on 06/07/2024 by Dettinger, Fonda LABOR, MD.  Called Nurse Triage reporting Back Pain.  Symptoms began several days ago.  Interventions attempted: OTC medications: ibuprofen , Prescription medications: meloxicam , Rest, hydration, or home remedies, and Ice/heat application.  Symptoms are: unchanged.  Triage Disposition: See HCP Within 4 Hours (Or PCP Triage)  Patient/caregiver understands and will follow disposition?: Yes  Copied from CRM #[id number]. Topic: Clinical - Red Word Triage >> Nov 05, 2024 12:04 PM Fredrica W wrote: Red Word that prompted transfer to Nurse Triage: Pain in back - pulled something - severity changes but unable to move without pain   ----------------------------------------------------------------------- From previous Reason for Contact - Scheduling: Patient/patient representative is calling to schedule an appointment. Refer to attachments for appointment information. Reason for Disposition  [1] SEVERE back pain (e.g., excruciating, unable to do any normal activities) AND [2] not improved 2 hours after pain medicine  Answer Assessment - Initial Assessment Questions Saturday pt slipped on water on the floor and since then her back has been killing her. Pain is mid pain all the way to her coccyx. Does have intermittent right leg tingling. She is on Meloxicam  daily and she states it is not helping at all. She states she can do daily activities but it takes extra time and causes pain. Pain is 8/10. Tried heating pad, ibuprofen , rest. Pt requested Wednesday appt due to work scheduled. RN gave instructions on when to go to the ER. Pt stated understanding.    1. ONSET: When did the pain begin? (e.g., minutes, hours, days)     Saturday 2. LOCATION: Where does it hurt? (upper, mid or lower back)     Mid back to lower back 3. SEVERITY: How bad  is the pain?  (e.g., Scale 1-10; mild, moderate, or severe)     8 4. PATTERN: Is the pain constant? (e.g., yes, no; constant, intermittent)      constant 5. RADIATION: Does the pain shoot into your legs or somewhere else?     Has intermittent tingling down right leg 6. CAUSE:  What do you think is causing the back pain?      From the slip.  7. MEDICINES: What have you taken so far for the pain? (e.g., nothing, acetaminophen , NSAIDS)     Meloxicam , ibuprofen  8. NEUROLOGIC SYMPTOMS: Do you have any weakness, numbness, or problems with bowel/bladder control?     denies 9. OTHER SYMPTOMS: Do you have any other symptoms? (e.g., fever, abdomen pain, burning with urination, blood in urine)       denies  Protocols used: Back Pain-A-AH

## 2024-11-07 ENCOUNTER — Ambulatory Visit

## 2024-11-07 ENCOUNTER — Encounter: Payer: Self-pay | Admitting: Family Medicine

## 2024-11-07 ENCOUNTER — Ambulatory Visit: Admitting: Family Medicine

## 2024-11-07 VITALS — BP 127/85 | HR 60 | Ht 65.0 in | Wt 231.0 lb

## 2024-11-07 DIAGNOSIS — M549 Dorsalgia, unspecified: Secondary | ICD-10-CM | POA: Diagnosis not present

## 2024-11-07 DIAGNOSIS — M545 Low back pain, unspecified: Secondary | ICD-10-CM

## 2024-11-07 DIAGNOSIS — T148XXA Other injury of unspecified body region, initial encounter: Secondary | ICD-10-CM

## 2024-11-07 LAB — MICROSCOPIC EXAMINATION
Bacteria, UA: NONE SEEN
Renal Epithel, UA: NONE SEEN /HPF
Yeast, UA: NONE SEEN

## 2024-11-07 LAB — URINALYSIS, COMPLETE
Bilirubin, UA: NEGATIVE
Glucose, UA: NEGATIVE
Ketones, UA: NEGATIVE
Leukocytes,UA: NEGATIVE
Nitrite, UA: NEGATIVE
Protein,UA: NEGATIVE
RBC, UA: NEGATIVE
Specific Gravity, UA: 1.01 (ref 1.005–1.030)
Urobilinogen, Ur: 0.2 mg/dL (ref 0.2–1.0)
pH, UA: 6.5 (ref 5.0–7.5)

## 2024-11-07 MED ORDER — METHYLPREDNISOLONE ACETATE 80 MG/ML IJ SUSP
80.0000 mg | Freq: Once | INTRAMUSCULAR | Status: AC
  Administered 2024-11-07: 80 mg via INTRAMUSCULAR

## 2024-11-07 MED ORDER — BACLOFEN 5 MG PO TABS: 5.0000 mg | ORAL_TABLET | Freq: Three times a day (TID) | ORAL | 0 refills | Status: DC | PRN

## 2024-11-07 NOTE — Progress Notes (Signed)
 "  BP 127/85   Pulse 60   Ht 5' 5 (1.651 m)   Wt 231 lb (104.8 kg)   LMP 07/02/2020   SpO2 99%   BMI 38.44 kg/m    Subjective:   Patient ID: Caroline Hopkins, female    DOB: 1981/11/03, 43 y.o.   MRN: [id number]  HPI: Caroline Hopkins is a 43 y.o. female presenting on 11/07/2024 for Back Pain (Mid-lower. ? Pulled muscle vs UTI)   Discussed the use of AI scribe software for clinical note transcription with the patient, who gave verbal consent to proceed.  History of Present Illness   Caroline Hopkins is a 43 year old female who presents with back pain following lifting activities. She is accompanied by her partner, who is a retired radiation protection practitioner.  Back pain - Onset four to five days ago after lifting and packing Christmas items - Pain located below bra strap, radiating downward - Occasional tingling on right side - No history of falls or traumatic injury - Pain exacerbated by prolonged sitting, standing, or lying down; unable to find a comfortable position for extended periods - Pressure applied to certain areas of the back provides temporary relief - Returned to work on Monday, completed a twelve-hour shift but experienced significant pain by end of day - Depo-Medrol 80 mg intramuscular  Symptom management and response to treatment - Meloxicam  used daily for knee pain; doubling dose did not alleviate back pain - Icy Hot, TENS unit, lidocaine  patches, and Voltaren  provided limited relief - TENS unit offers some relief when in use, but difficult to maintain during work - Vicks VapoRub applied at night improves sleep  Urinary symptoms - No dysuria, hematuria, or urinary pain - Urine appeared dark; left a sample for testing          Relevant past medical, surgical, family and social history reviewed and updated as indicated. Interim medical history since our last visit reviewed. Allergies and medications reviewed and updated.  Review of Systems  Constitutional:  Negative for  chills and fever.  Eyes:  Negative for redness and visual disturbance.  Respiratory:  Negative for chest tightness and shortness of breath.   Cardiovascular:  Negative for chest pain and leg swelling.  Musculoskeletal:  Positive for arthralgias, back pain and myalgias. Negative for gait problem.  Skin:  Negative for rash.  Neurological:  Negative for light-headedness and headaches.  Psychiatric/Behavioral:  Negative for agitation and behavioral problems.   All other systems reviewed and are negative.   Per HPI unless specifically indicated above   Allergies as of 11/07/2024       Reactions   Latex Anaphylaxis   Blisters    Rocephin [ceftriaxone] Anaphylaxis   Oxycodone  Nausea And Vomiting   Wellbutrin  [bupropion ]    Headaches        Medication List        Accurate as of November 07, 2024 11:12 AM. If you have any questions, ask your nurse or doctor.          Baclofen 5 MG Tabs Take 1 tablet (5 mg total) by mouth 3 (three) times daily as needed. Started by: Fonda Levins, MD   ibuprofen  800 MG tablet Commonly known as: ADVIL  Take 1 tablet (800 mg total) by mouth every 6 (six) hours as needed.   meloxicam  15 MG tablet Commonly known as: MOBIC  TAKE ONE TABLET BY MOUTH ONCE DAILY   omeprazole  20 MG capsule Commonly known as: PRILOSEC TAKE ONE CAPSULE BY MOUTH  ONCE DAILY   ondansetron  4 MG disintegrating tablet Commonly known as: ZOFRAN -ODT Take 1 tablet (4 mg total) by mouth every 8 (eight) hours as needed.   Opzelura  1.5 % Crea Generic drug: Ruxolitinib Phosphate  Apply 1 Application topically 2 (two) times daily as needed.         Objective:   BP 127/85   Pulse 60   Ht 5' 5 (1.651 m)   Wt 231 lb (104.8 kg)   LMP 07/02/2020   SpO2 99%   BMI 38.44 kg/m   Wt Readings from Last 3 Encounters:  11/07/24 231 lb (104.8 kg)  06/07/24 226 lb (102.5 kg)  06/01/24 228 lb (103.4 kg)    Physical Exam Vitals and nursing note reviewed.   Musculoskeletal:     Thoracic back: Spasms, tenderness and bony tenderness present. No deformity. Normal range of motion.     Lumbar back: Spasms, tenderness and bony tenderness present. Negative right straight leg raise test and negative left straight leg raise test.    Physical Exam   CHEST: Lungs clear to auscultation bilaterally. MUSCULOSKELETAL: Tenderness bilaterally along the spine.        Assessment & Plan:   Problem List Items Addressed This Visit   None Visit Diagnoses       Mid back pain    -  Primary   Relevant Medications   methylPREDNISolone acetate (DEPO-MEDROL) injection 80 mg (Start on 11/07/2024 11:15 AM)   Baclofen 5 MG TABS   Other Relevant Orders   Urinalysis, Complete   Urine Culture   DG Lumbar Spine 2-3 Views   DG Thoracic Spine 2 View     Muscle strain       Relevant Medications   methylPREDNISolone acetate (DEPO-MEDROL) injection 80 mg (Start on 11/07/2024 11:15 AM)   Other Relevant Orders   DG Lumbar Spine 2-3 Views   DG Thoracic Spine 2 View     Lumbar pain       Relevant Medications   methylPREDNISolone acetate (DEPO-MEDROL) injection 80 mg (Start on 11/07/2024 11:15 AM)   Baclofen 5 MG TABS          Thoracic back pain Acute thoracic back pain, likely musculoskeletal, exacerbated by activity, relieved by TENS and Vicks VapoRub. Differential includes muscular strain or spasm. - Administered steroid injection for inflammation. - Prescribed baclofen as needed for muscle relaxation. - Ordered thoracic spine x-ray. - Encouraged TENS, stretching, heating pads. - Advised alternating positions every 15 minutes. - Discussed potential referral to chiropractor or physical therapy if symptoms persist.          Follow up plan: Return if symptoms worsen or fail to improve.  Counseling provided for all of the vaccine components Orders Placed This Encounter  Procedures   Urine Culture   DG Lumbar Spine 2-3 Views   DG Thoracic Spine 2 View    Urinalysis, Complete    Fonda Levins, MD [facility] 11/07/2024, 11:12 AM     "

## 2024-11-09 ENCOUNTER — Ambulatory Visit: Payer: Self-pay | Admitting: Family Medicine

## 2024-11-09 LAB — URINE CULTURE: Organism ID, Bacteria: NO GROWTH

## 2024-11-12 ENCOUNTER — Telehealth: Payer: Self-pay | Admitting: *Deleted

## 2024-11-12 NOTE — Telephone Encounter (Signed)
 Pt aware of negative urine and that we are waiting on radiology to read the xray.

## 2024-11-12 NOTE — Telephone Encounter (Signed)
 Copied from CRM #[id number]. Topic: Clinical - Lab/Test Results >> Nov 12, 2024  3:33 PM Caroline Hopkins wrote: Reason for CRM: the patient is calling cause her MyChart still not fixed and she was calling for her Xray results and she was looking test results so know if she needs to schedule with a chiropractor for her back concern  Pt num  [phone]

## 2024-11-15 ENCOUNTER — Other Ambulatory Visit: Payer: Self-pay | Admitting: Family Medicine

## 2024-11-15 MED ORDER — BACLOFEN 5 MG PO TABS: 5.0000 mg | ORAL_TABLET | Freq: Three times a day (TID) | ORAL | 0 refills | Status: AC | PRN

## 2024-11-15 NOTE — Telephone Encounter (Signed)
 It does not look like your x-ray has been read yet, radiology is running behind so maybe another week or so.

## 2024-11-15 NOTE — Telephone Encounter (Signed)
 Copied from CRM #[id number]. Topic: Clinical - Lab/Test Results >> Nov 12, 2024  3:33 PM Caroline Hopkins wrote: Reason for CRM: the patient is calling cause her MyChart still not fixed and she was calling for her Xray results and she was looking test results so know if she needs to schedule with a chiropractor for her back concern  Pt num  [phone] >> Nov 15, 2024  1:31 PM [name] wrote: Patient called in requesting for Dr. Williemae nurse to give her a call in regards to test results and she also needs to update Dr. Maryanne on her back. States her MyChart is still not fixed so she can't message him. Please give her a call back at [phone].

## 2024-11-15 NOTE — Telephone Encounter (Signed)
 Made pt aware that the results are not ready and that we would call once Dettinger reviews them.   Pt wanted to make Dettinger aware that the injection did help through the weekend but not now. The muscle relaxer makes things tolerable. She would like a refill sent to pharmacy on file until we get the results.
